# Patient Record
Sex: Female | Born: 1955
Health system: Southern US, Community
[De-identification: ages and names within clinical notes are randomized; demographics above are authoritative.]

## PROBLEM LIST (undated history)

## (undated) DIAGNOSIS — E079 Disorder of thyroid, unspecified: Secondary | ICD-10-CM

## (undated) DIAGNOSIS — K900 Celiac disease: Secondary | ICD-10-CM

## (undated) DIAGNOSIS — Z1371 Encounter for nonprocreative screening for genetic disease carrier status: Secondary | ICD-10-CM

## (undated) DIAGNOSIS — M8580 Other specified disorders of bone density and structure, unspecified site: Secondary | ICD-10-CM

## (undated) DIAGNOSIS — I10 Essential (primary) hypertension: Secondary | ICD-10-CM

## (undated) DIAGNOSIS — Z8659 Personal history of other mental and behavioral disorders: Secondary | ICD-10-CM

## (undated) HISTORY — DX: Encounter for nonprocreative screening for genetic disease carrier status: Z13.71

## (undated) HISTORY — DX: Celiac disease: K90.0

## (undated) HISTORY — DX: Other specified disorders of bone density and structure, unspecified site: M85.80

## (undated) HISTORY — DX: Disorder of thyroid, unspecified: E07.9

## (undated) HISTORY — PX: ABDOMINAL HYSTERECTOMY: SHX81

## (undated) HISTORY — DX: Personal history of other mental and behavioral disorders: Z86.59

---

## 1999-06-29 ENCOUNTER — Other Ambulatory Visit: Admission: RE | Admit: 1999-06-29 | Discharge: 1999-06-29 | Payer: Self-pay | Admitting: *Deleted

## 2000-03-28 ENCOUNTER — Encounter: Admission: RE | Admit: 2000-03-28 | Discharge: 2000-03-28 | Payer: Self-pay | Admitting: Family Medicine

## 2000-03-28 ENCOUNTER — Encounter: Payer: Self-pay | Admitting: Family Medicine

## 2000-05-10 ENCOUNTER — Encounter: Payer: Self-pay | Admitting: *Deleted

## 2000-05-10 ENCOUNTER — Other Ambulatory Visit: Admission: RE | Admit: 2000-05-10 | Discharge: 2000-05-10 | Payer: Self-pay | Admitting: *Deleted

## 2000-05-10 ENCOUNTER — Encounter: Admission: RE | Admit: 2000-05-10 | Discharge: 2000-05-10 | Payer: Self-pay | Admitting: *Deleted

## 2000-05-10 ENCOUNTER — Encounter (INDEPENDENT_AMBULATORY_CARE_PROVIDER_SITE_OTHER): Payer: Self-pay | Admitting: *Deleted

## 2000-08-03 ENCOUNTER — Other Ambulatory Visit: Admission: RE | Admit: 2000-08-03 | Discharge: 2000-08-03 | Payer: Self-pay | Admitting: Obstetrics and Gynecology

## 2001-07-24 ENCOUNTER — Other Ambulatory Visit: Admission: RE | Admit: 2001-07-24 | Discharge: 2001-07-24 | Payer: Self-pay | Admitting: Obstetrics and Gynecology

## 2001-07-26 ENCOUNTER — Encounter: Admission: RE | Admit: 2001-07-26 | Discharge: 2001-07-26 | Payer: Self-pay | Admitting: Obstetrics and Gynecology

## 2001-07-26 ENCOUNTER — Encounter: Payer: Self-pay | Admitting: Obstetrics and Gynecology

## 2001-11-27 ENCOUNTER — Encounter (INDEPENDENT_AMBULATORY_CARE_PROVIDER_SITE_OTHER): Payer: Self-pay

## 2001-11-27 ENCOUNTER — Observation Stay (HOSPITAL_COMMUNITY): Admission: RE | Admit: 2001-11-27 | Discharge: 2001-11-28 | Payer: Self-pay | Admitting: Obstetrics and Gynecology

## 2001-11-27 HISTORY — PX: VAGINAL HYSTERECTOMY: SHX2639

## 2002-08-20 ENCOUNTER — Encounter: Payer: Self-pay | Admitting: Obstetrics and Gynecology

## 2002-08-20 ENCOUNTER — Encounter: Admission: RE | Admit: 2002-08-20 | Discharge: 2002-08-20 | Payer: Self-pay | Admitting: Obstetrics and Gynecology

## 2003-08-28 ENCOUNTER — Encounter: Admission: RE | Admit: 2003-08-28 | Discharge: 2003-08-28 | Payer: Self-pay | Admitting: Obstetrics and Gynecology

## 2004-07-11 HISTORY — PX: FEMORAL HERNIA REPAIR: SUR1179

## 2004-09-10 ENCOUNTER — Encounter: Admission: RE | Admit: 2004-09-10 | Discharge: 2004-09-10 | Payer: Self-pay | Admitting: Family Medicine

## 2005-09-08 DIAGNOSIS — K900 Celiac disease: Secondary | ICD-10-CM

## 2005-09-08 HISTORY — DX: Celiac disease: K90.0

## 2005-10-03 ENCOUNTER — Ambulatory Visit (HOSPITAL_COMMUNITY): Admission: RE | Admit: 2005-10-03 | Discharge: 2005-10-03 | Payer: Self-pay | Admitting: Gastroenterology

## 2005-10-03 ENCOUNTER — Encounter (INDEPENDENT_AMBULATORY_CARE_PROVIDER_SITE_OTHER): Payer: Self-pay | Admitting: Specialist

## 2005-10-04 ENCOUNTER — Encounter (INDEPENDENT_AMBULATORY_CARE_PROVIDER_SITE_OTHER): Payer: Self-pay | Admitting: *Deleted

## 2005-10-04 ENCOUNTER — Ambulatory Visit (HOSPITAL_COMMUNITY): Admission: RE | Admit: 2005-10-04 | Discharge: 2005-10-04 | Payer: Self-pay | Admitting: Gastroenterology

## 2005-10-04 HISTORY — PX: ESOPHAGOGASTRODUODENOSCOPY ENDOSCOPY: SHX5814

## 2005-10-04 HISTORY — PX: COLONOSCOPY: SHX174

## 2005-10-12 ENCOUNTER — Encounter: Admission: RE | Admit: 2005-10-12 | Discharge: 2005-10-12 | Payer: Self-pay | Admitting: Obstetrics & Gynecology

## 2005-10-12 IMAGING — MG MM MAMMO SCREENING
5 series · 5 of 5 positions shown · non-contrast
Comparison: none

SCREENING MAMMOGRAM:
The breast tissue is extremely dense.  Microcalcifications are present in the left breast.  
Characterization with magnification views is recommended.  No mass or malignant type calcifications
are identified in the right breast.  Compared with prior studies.

[R CC]
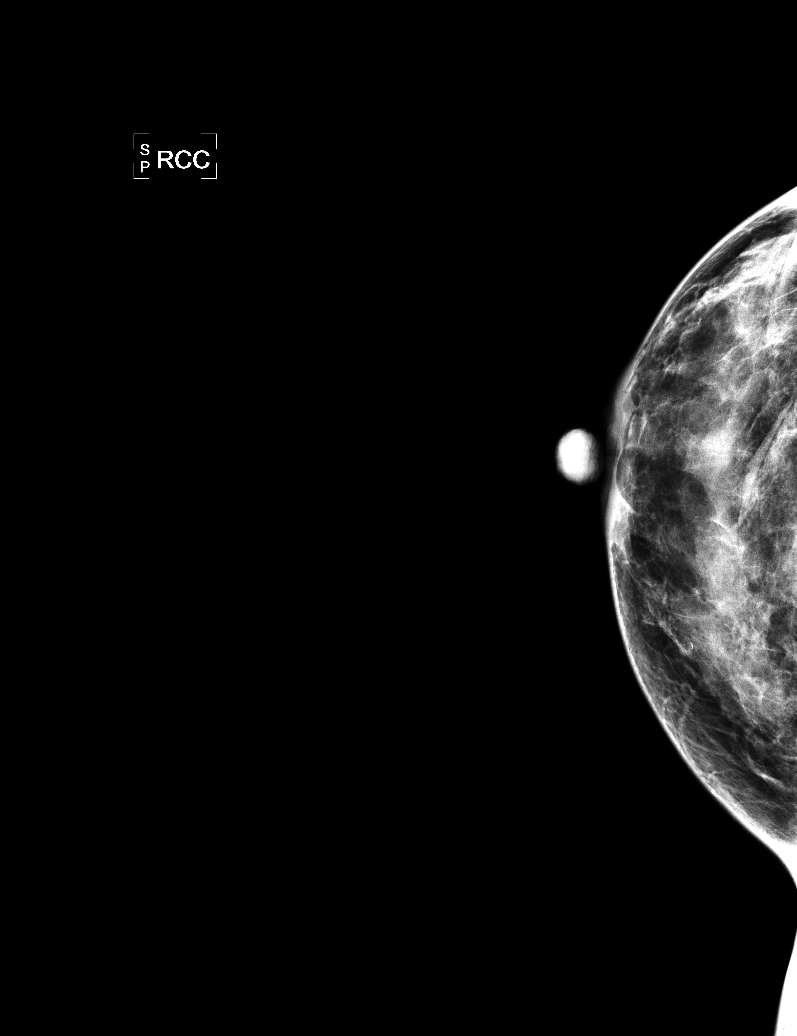

[L CC]
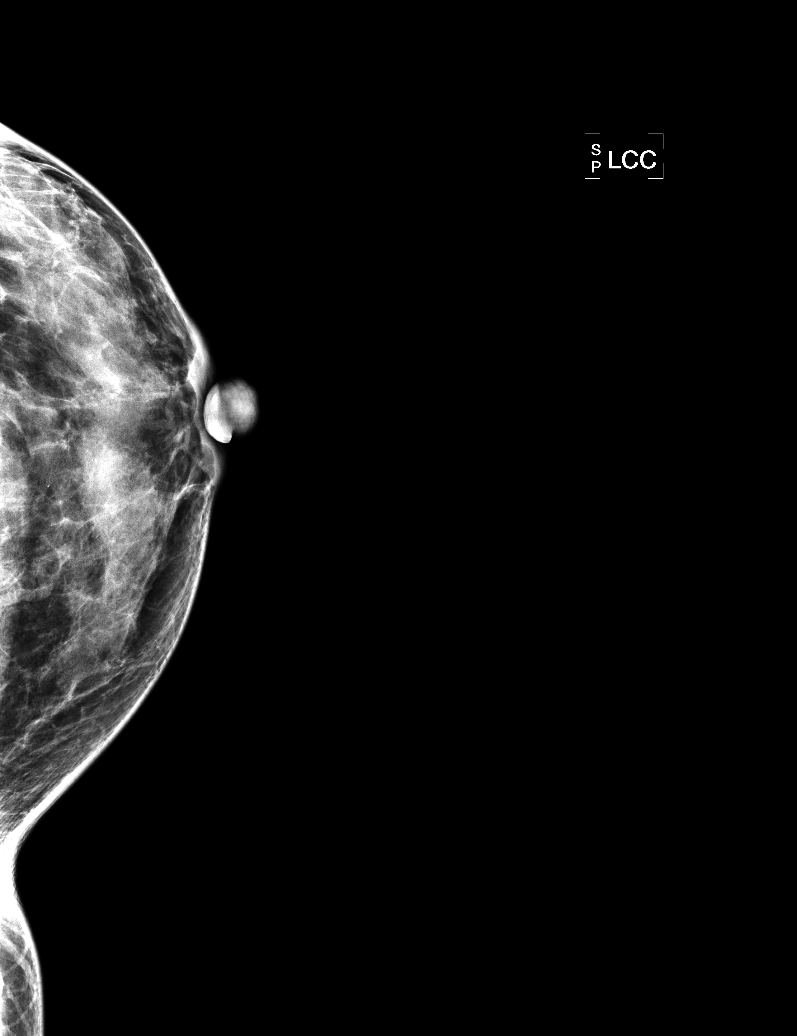

[L MLO]
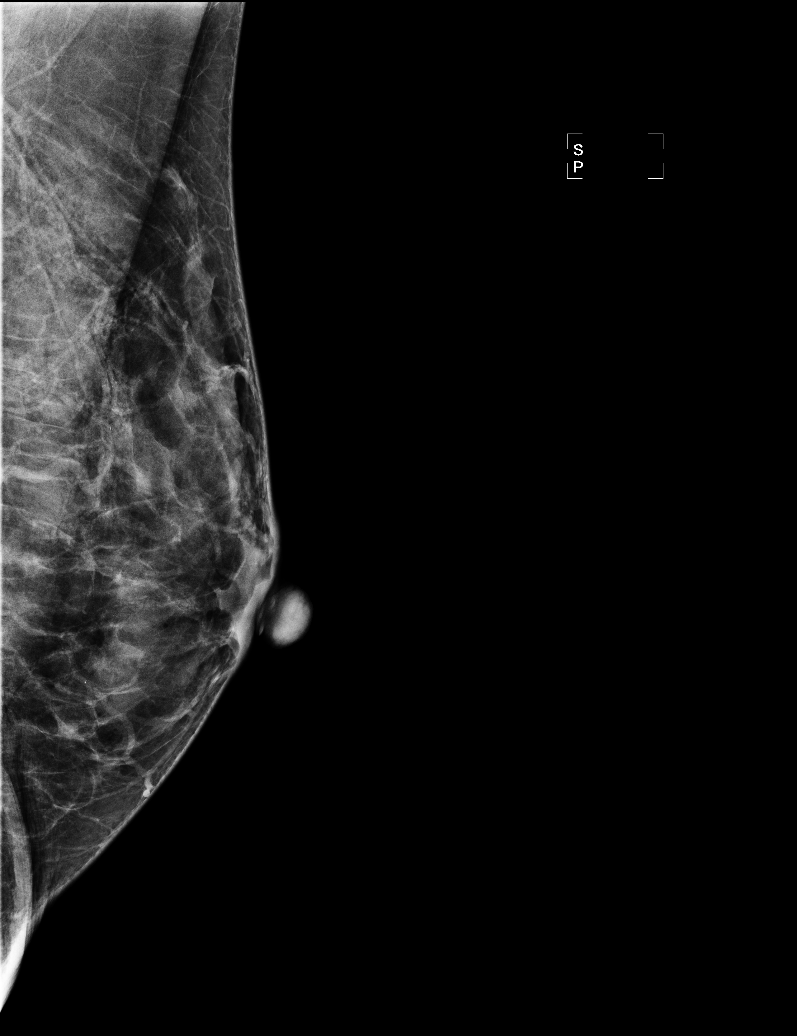

[R MLO (1 of 2)]
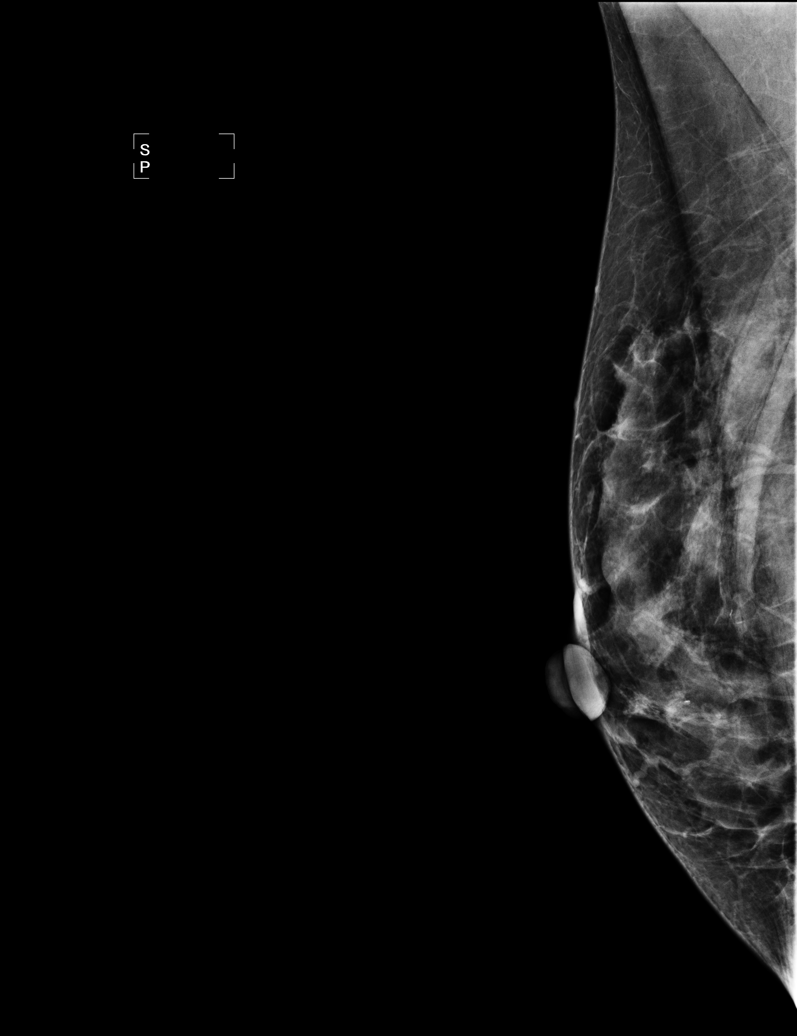

[R MLO (2 of 2)]
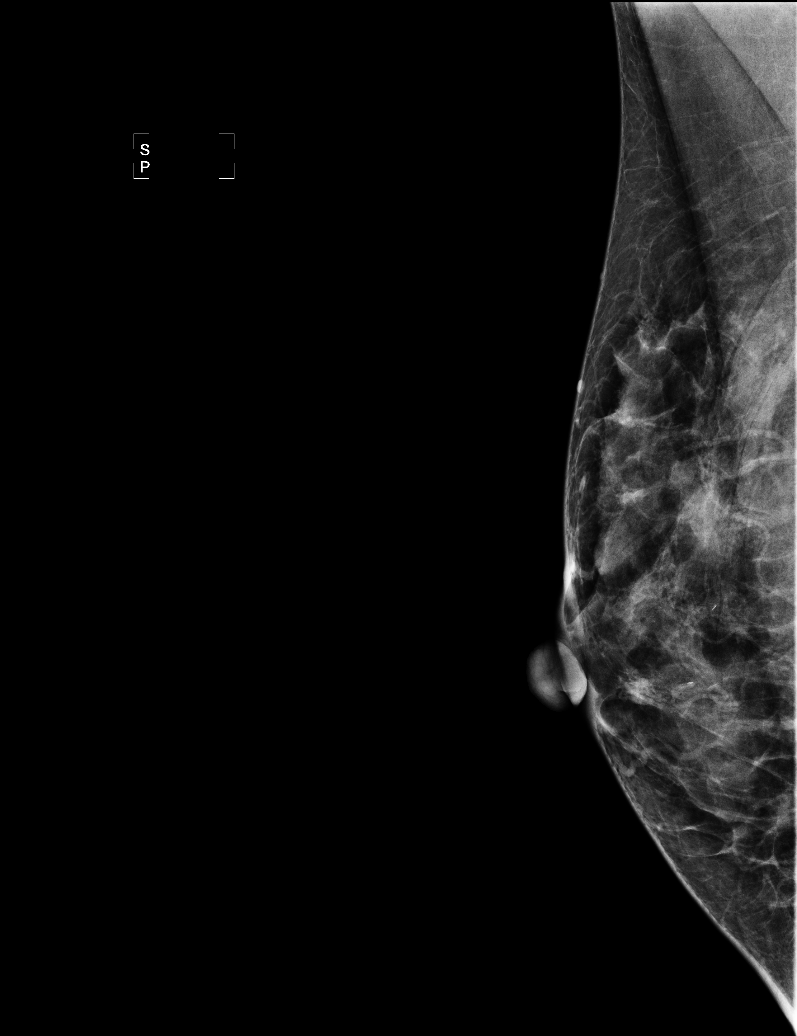

[5 of 5 positions shown; findings below may reference images not displayed]

IMPRESSION: Calcifications, left breast.  Additional evaluation is indicated. The patient will be contacted for
additional studies and a supplementary report will follow.  No specific mammographic evidence of 
malignancy, right breast.

ASSESSMENT: Need additional imaging evaluation and/or prior mammograms for comparison - BI-RADS 0

Further imaging of the left breast.

## 2005-11-11 ENCOUNTER — Encounter: Admission: RE | Admit: 2005-11-11 | Discharge: 2005-11-11 | Payer: Self-pay | Admitting: Obstetrics and Gynecology

## 2005-11-11 IMAGING — MG MM DIAGNOSTIC LTD LEFT
2 series · 2 of 2 positions shown · non-contrast
Comparison: Prior studies from [DATE] and [DATE].

[REDACTED] LEFT
CC and MLO view(s) were taken of the left breast.

DIGITAL LIMITED LEFT DIAGNOSTIC MAMMOGRAM:
CLINICAL DATA: 49-year-old female with abnormal screening mammogram with microcalcifications.

[L CC]
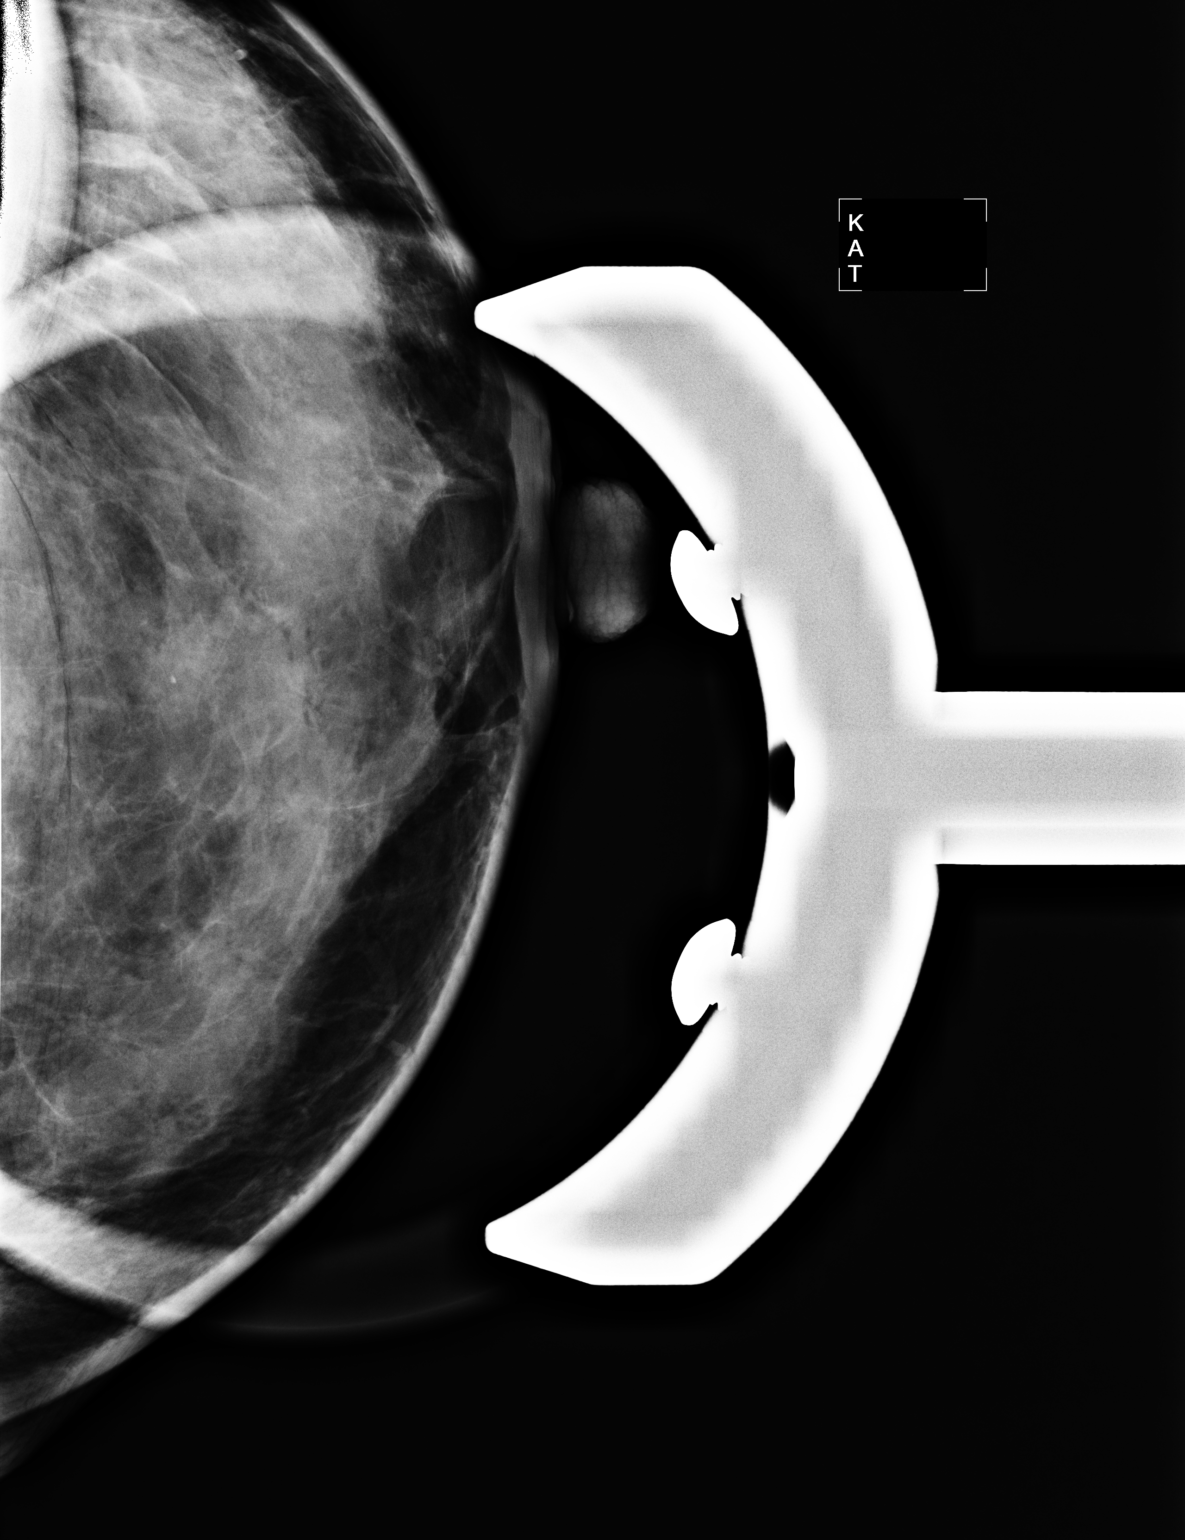

[L ML]
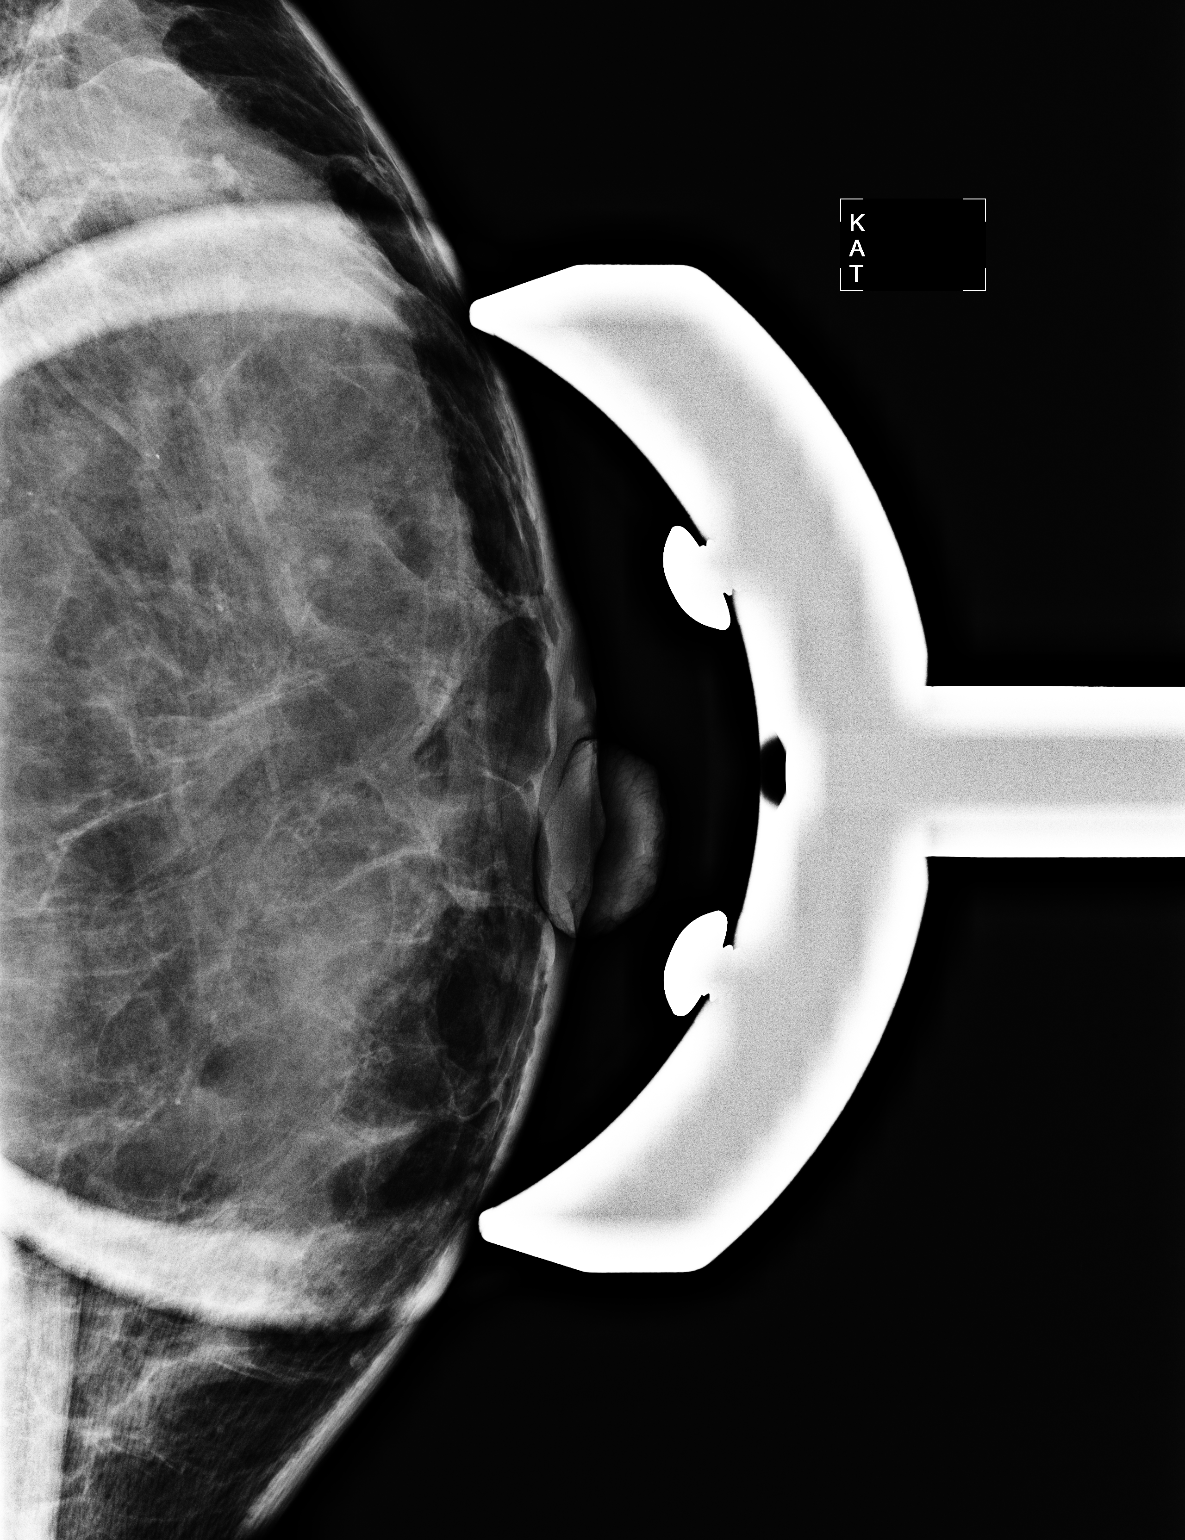

[2 of 2 positions shown; findings below may reference images not displayed]

Spot magnification views of the left subareolar region in the CC and MLO projection demonstrate a 
cluster of  ill-defined microcalcification smudgy and rounded in appearance.  These were vaguely 
present on the [NC] study.
IMPRESSION: Stable cluster of microcalcifications in the left subareolar region.  No evidence of malignancy.  
Recommend annual screening mammogram.

ASSESSMENT: Negative - BI-RADS 1

Screening mammogram of both breasts in 10 months.
, THIS PROCEDURE WAS A DIGITAL MAMMOGRAM.

## 2005-11-14 ENCOUNTER — Encounter: Admission: RE | Admit: 2005-11-14 | Discharge: 2005-11-14 | Payer: Self-pay | Admitting: Gastroenterology

## 2006-10-16 ENCOUNTER — Encounter: Admission: RE | Admit: 2006-10-16 | Discharge: 2006-10-16 | Payer: Self-pay | Admitting: Obstetrics & Gynecology

## 2007-11-05 ENCOUNTER — Encounter: Admission: RE | Admit: 2007-11-05 | Discharge: 2007-11-05 | Payer: Self-pay | Admitting: Family Medicine

## 2007-11-05 IMAGING — MG MM SCREEN MAMMOGRAM BILATERAL
4 series · 4 of 4 positions shown · non-contrast
Comparison: none

DG SCREEN MAMMOGRAM BILATERAL
Bilateral CC and MLO view(s) were taken.

DIGITAL SCREENING MAMMOGRAM WITH CAD:
The breast tissue is extremely dense.  No masses or malignant type calcifications are identified.  
Compared with prior studies.

[R CC]
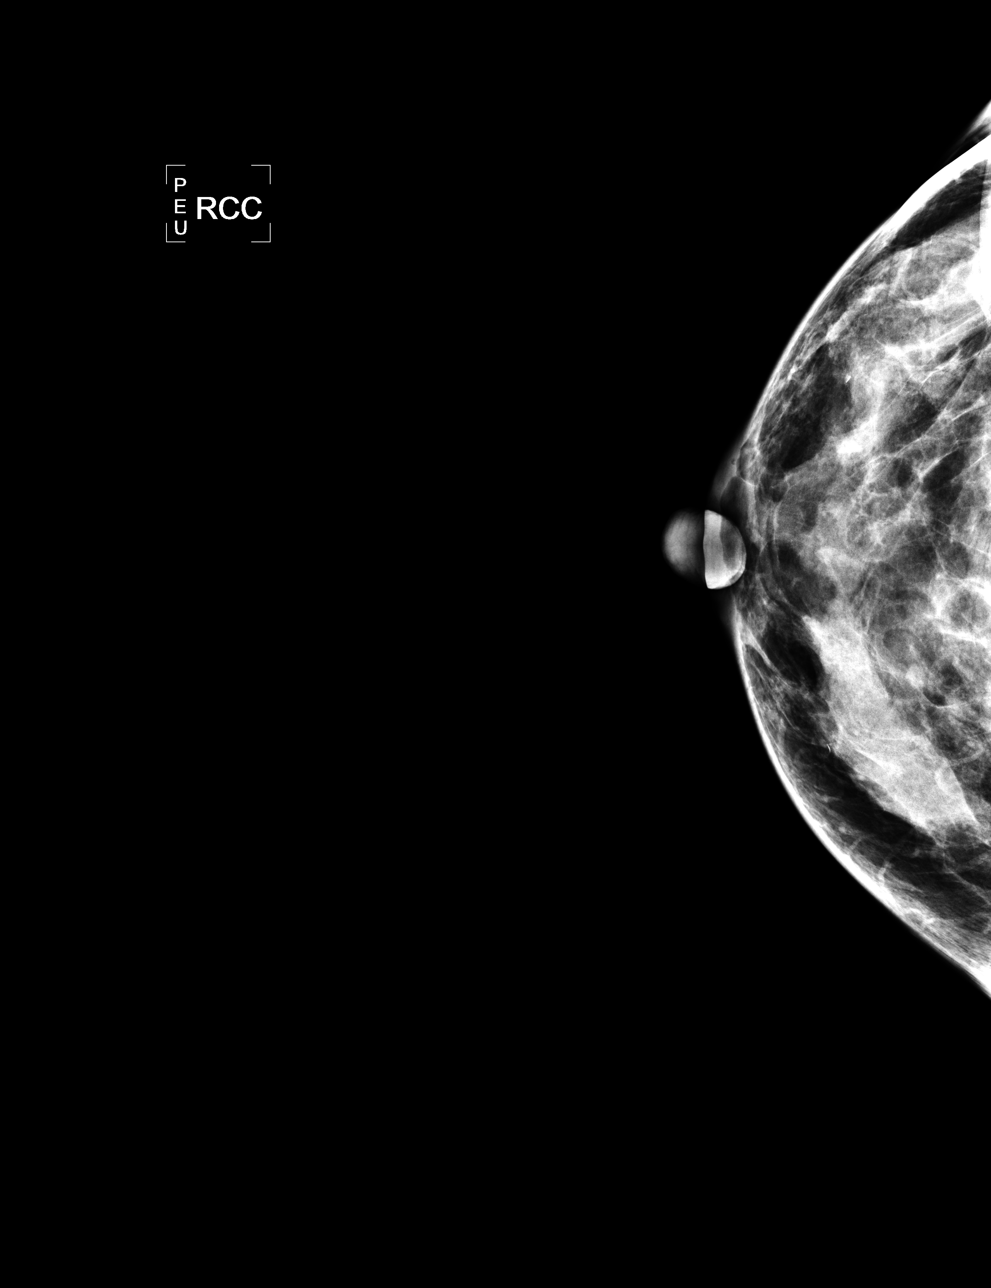

[L CC]
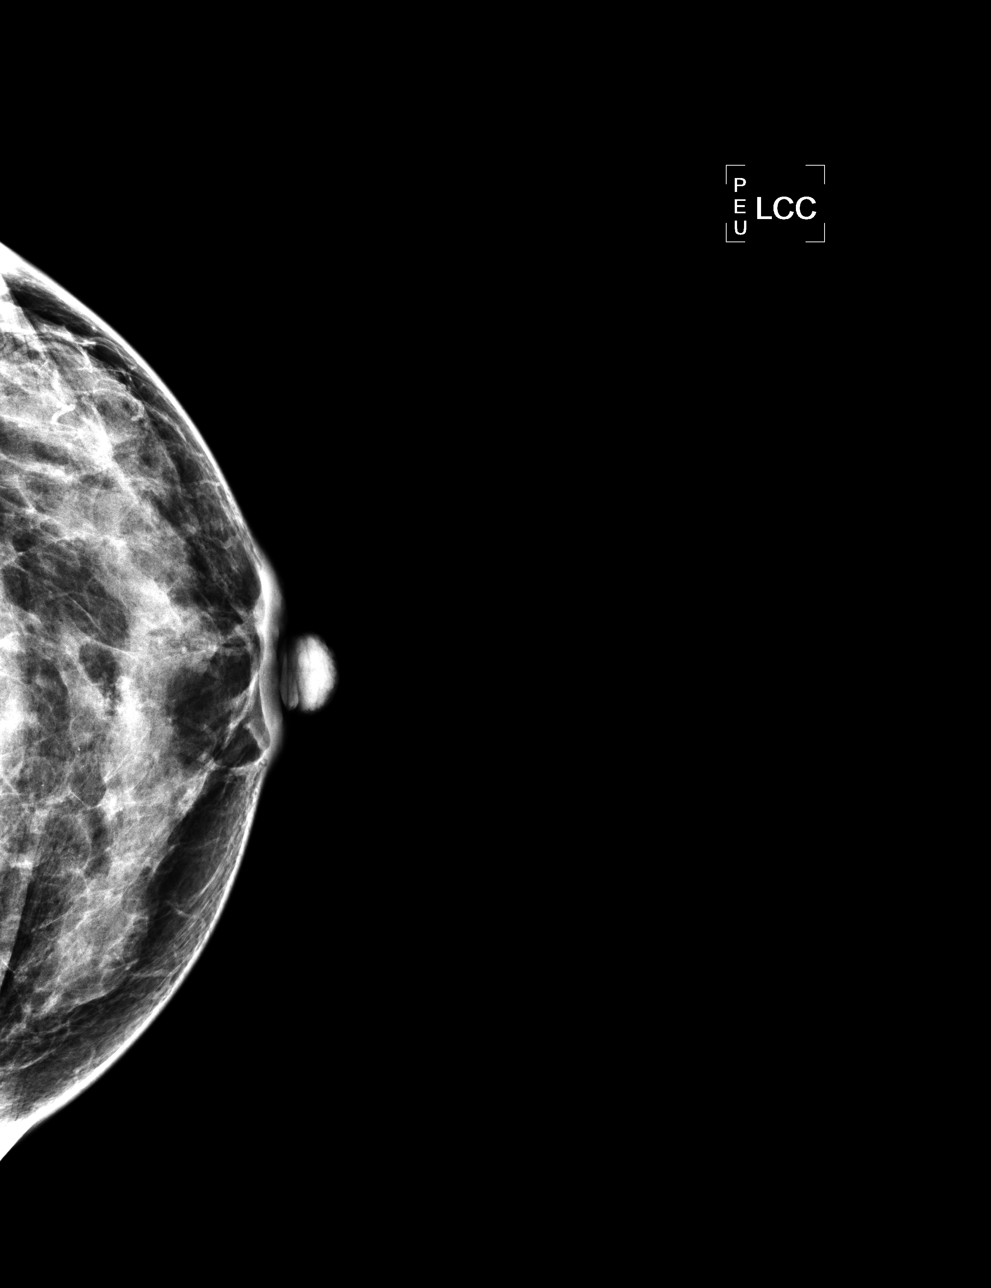

[L MLO]
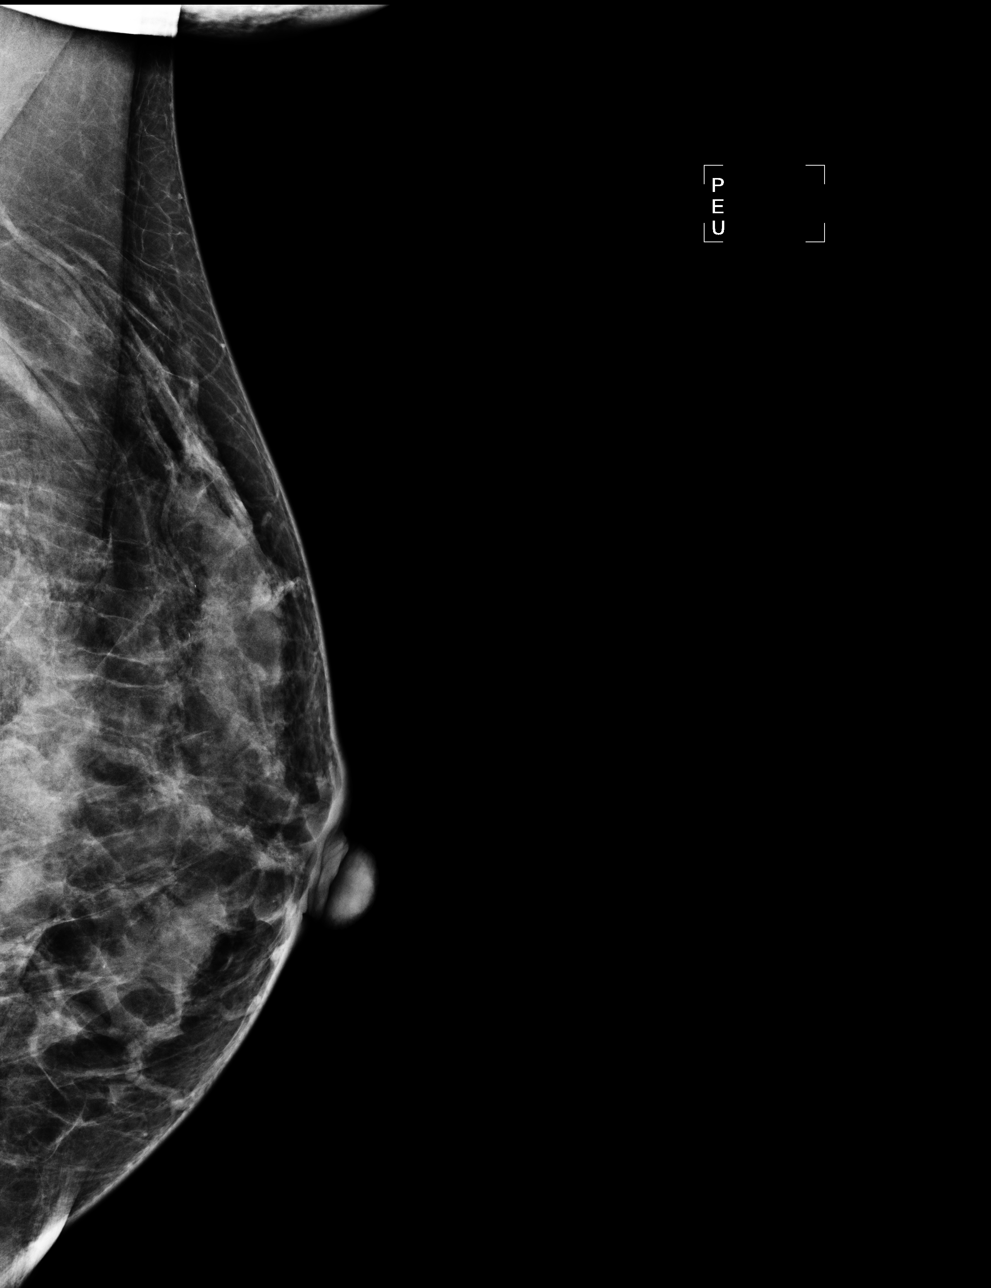

[R MLO]
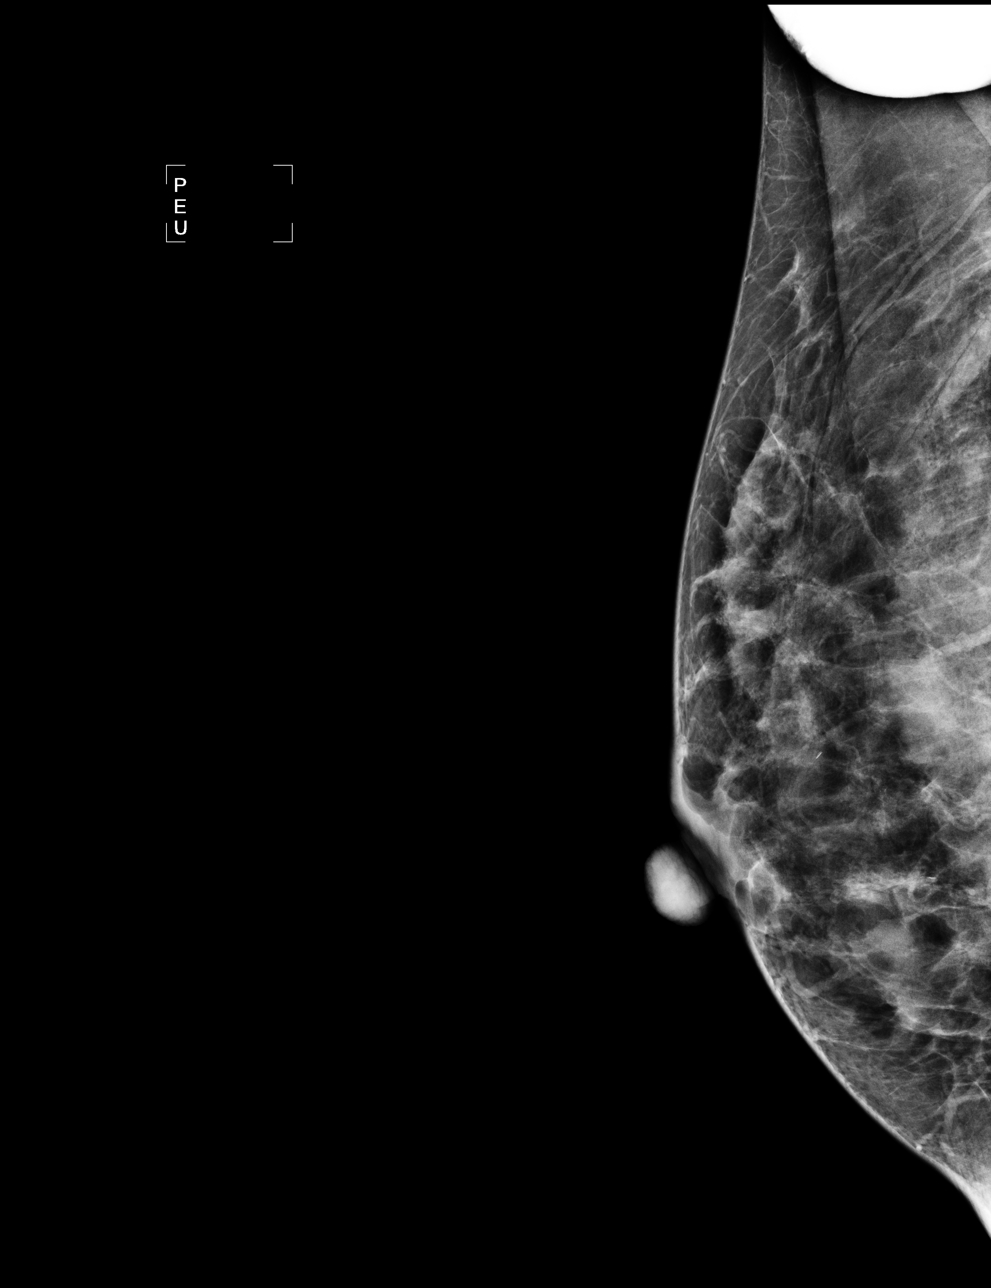

[4 of 4 positions shown; findings below may reference images not displayed]

IMPRESSION: No specific mammographic evidence of malignancy.  Next screening mammogram is recommended in one 
year.

ASSESSMENT: Negative - BI-RADS 1

Screening mammogram in 1 year.
ANALYZED BY COMPUTER AIDED DETECTION. , THIS PROCEDURE WAS A DIGITAL MAMMOGRAM.

## 2008-03-11 LAB — HM DEXA SCAN

## 2008-03-19 ENCOUNTER — Encounter: Admission: RE | Admit: 2008-03-19 | Discharge: 2008-03-19 | Payer: Self-pay | Admitting: Obstetrics & Gynecology

## 2008-11-05 ENCOUNTER — Encounter: Admission: RE | Admit: 2008-11-05 | Discharge: 2008-11-05 | Payer: Self-pay | Admitting: Family Medicine

## 2008-11-05 IMAGING — MG MM SCREEN MAMMOGRAM BILATERAL
2 series · 2 of 2 positions shown · non-contrast
Comparison: none

DG SCREEN MAMMOGRAM BILATERAL
Bilateral CC and MLO view(s) were taken.

DIGITAL SCREENING MAMMOGRAM WITH CAD:
The breast tissue is heterogeneously dense.  Possible architectural distortion is noted in the 
right breast.  Spot compression views and possibly sonography are recommended for further 
evaluation.  In the left breast, no masses or malignant type calcifications are identified.  
Compared with prior studies.

[L CC]
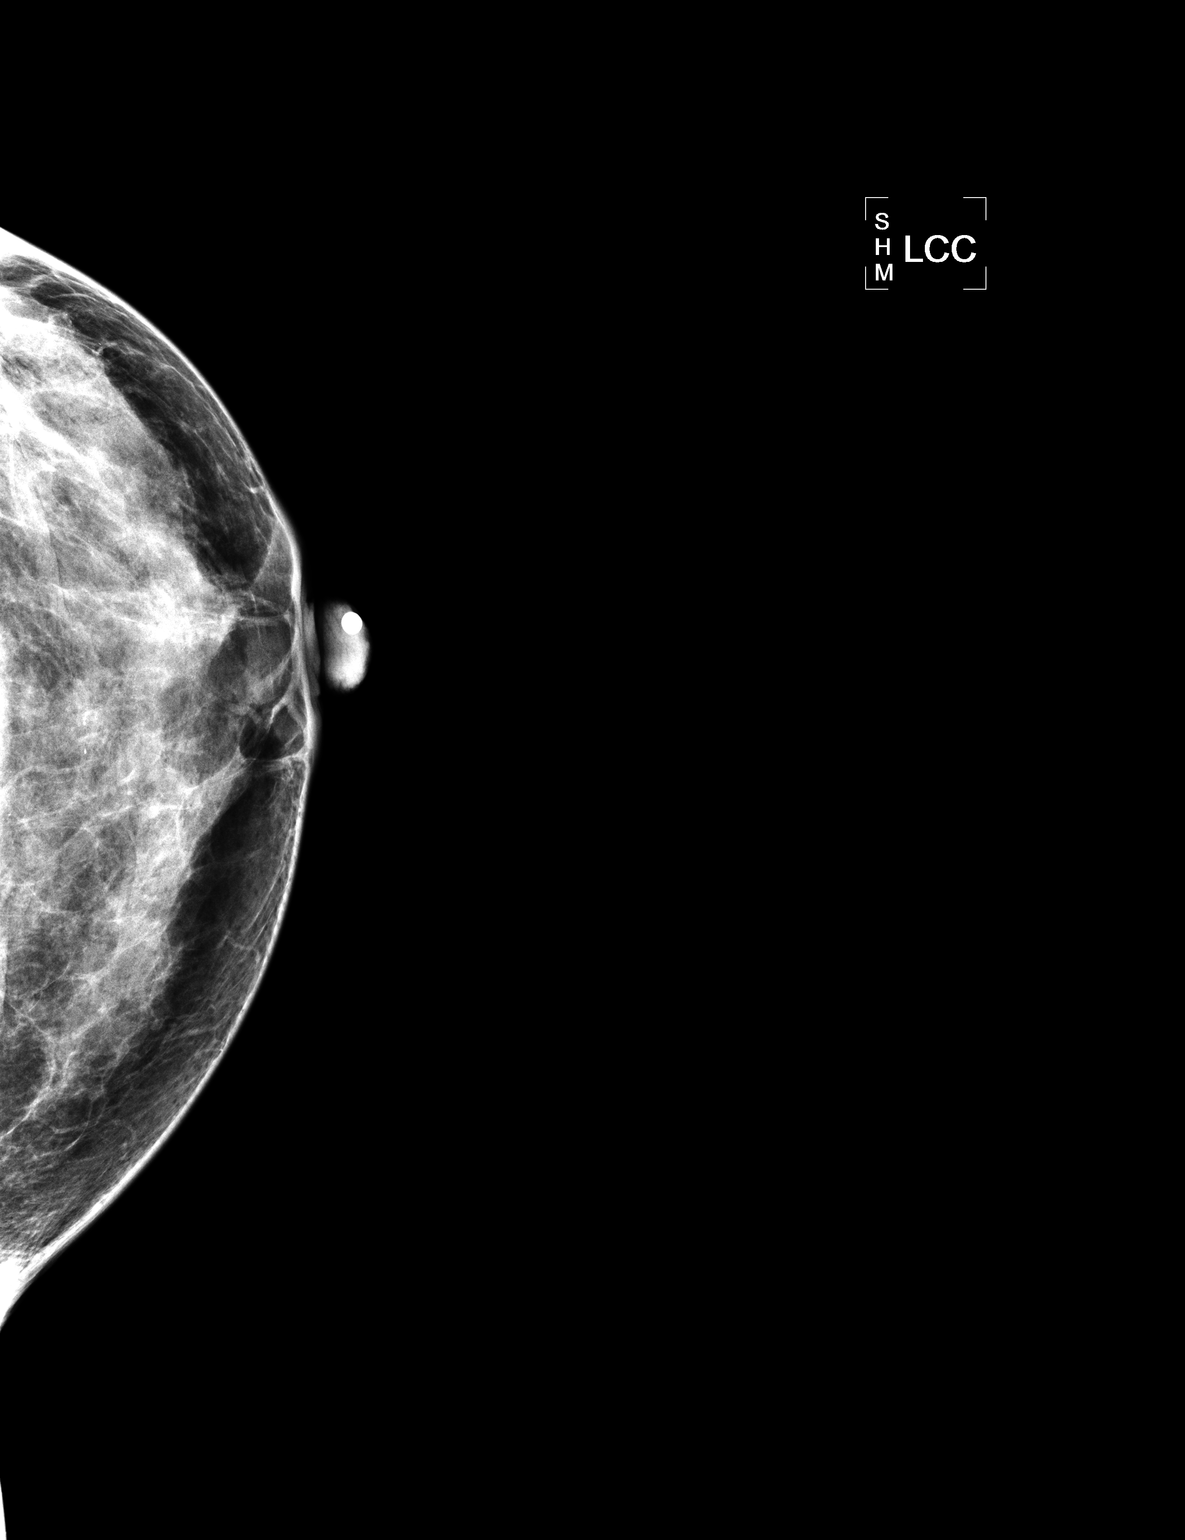

[R MLO]
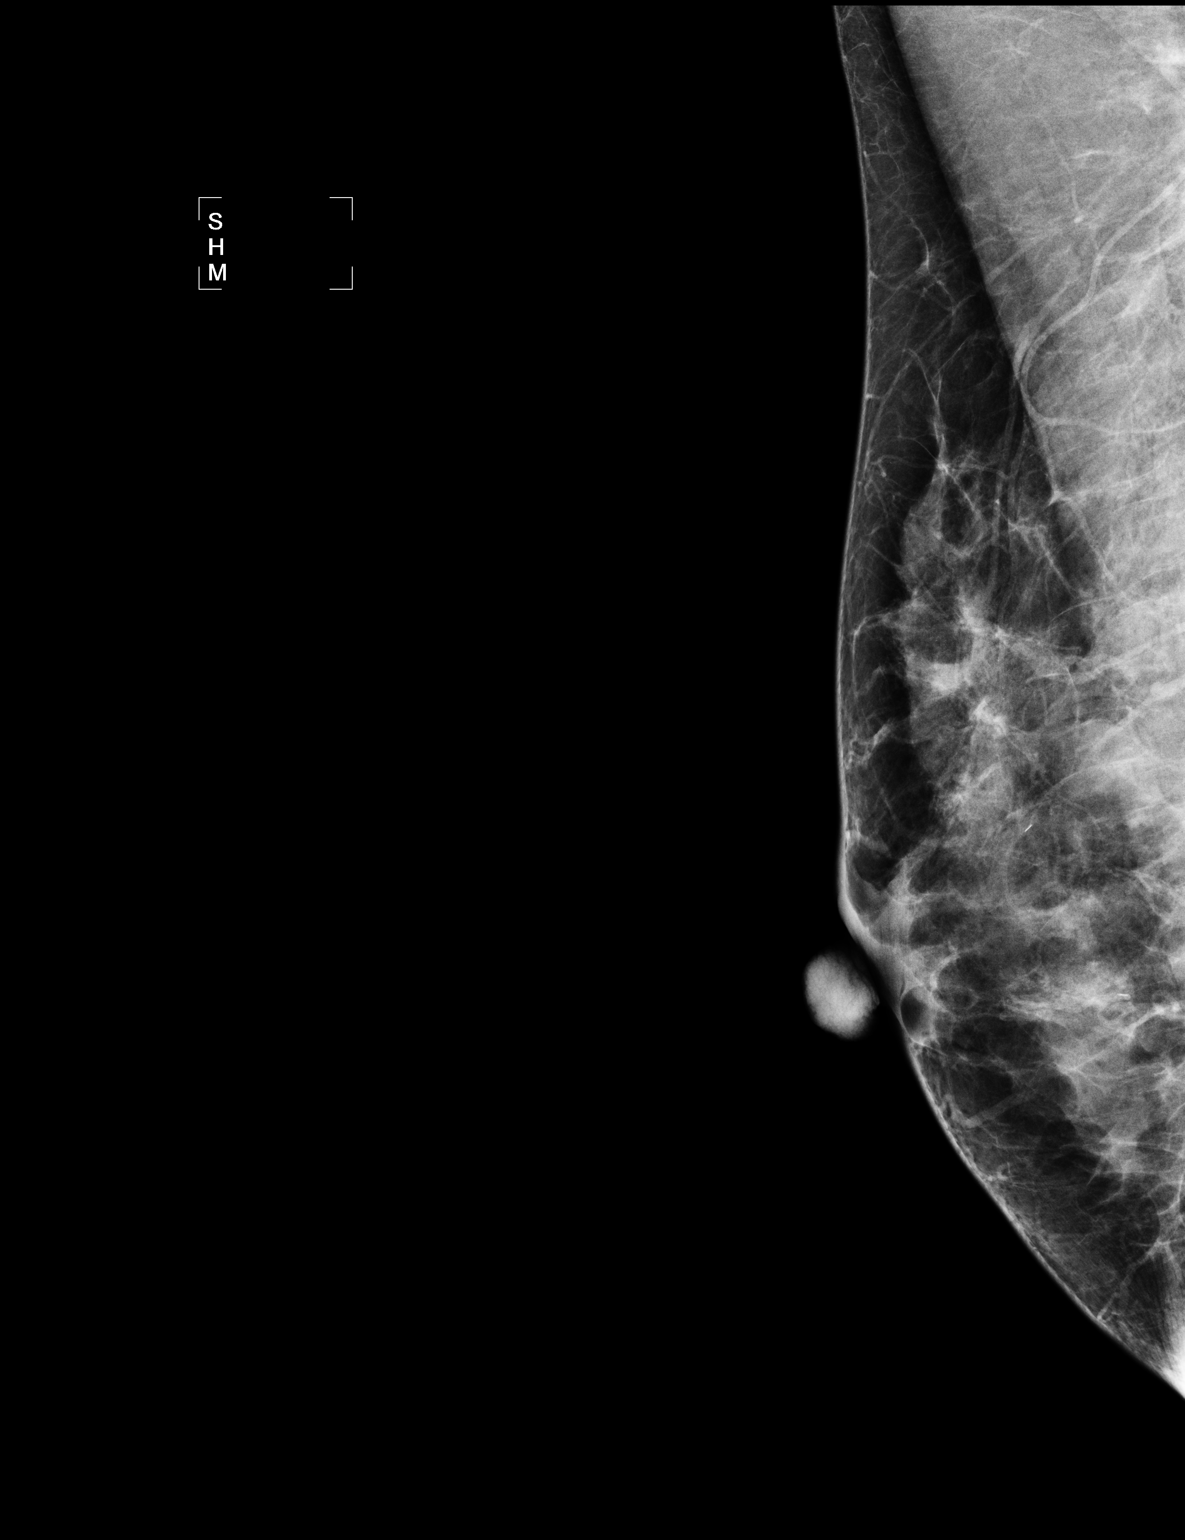

[2 of 2 positions shown; findings below may reference images not displayed]

IMPRESSION: Possible architectural distortion, right breast.  Additional evaluation is indicated.  The patient 
will be contacted for additional studies and a supplementary report will follow.  No specific 
mammographic evidence of malignancy, left breast.

ASSESSMENT: Need additional imaging evaluation and/or prior mammograms for comparison - BI-RADS 0

Further imaging of the right breast.
ANALYZED BY COMPUTER AIDED DETECTION. , THIS PROCEDURE WAS A DIGITAL MAMMOGRAM.

## 2008-11-07 ENCOUNTER — Encounter: Admission: RE | Admit: 2008-11-07 | Discharge: 2008-11-07 | Payer: Self-pay | Admitting: Family Medicine

## 2008-11-07 IMAGING — MG MM DIAGNOSTIC LTD RIGHT
1 series · 1 of 1 positions shown · non-contrast
Comparison: [DATE] and [DATE] as well as prior exams from
[F4] and [F4].

CLINICAL DATA: The patient presents for additional views of the
right breast as a follow up to recent screening mammogram from
[DATE] demonstrating possible distortion in the upper right
breast.

DIGITAL DIAGNOSTIC RIGHT MAMMOGRAM

[R MLO]
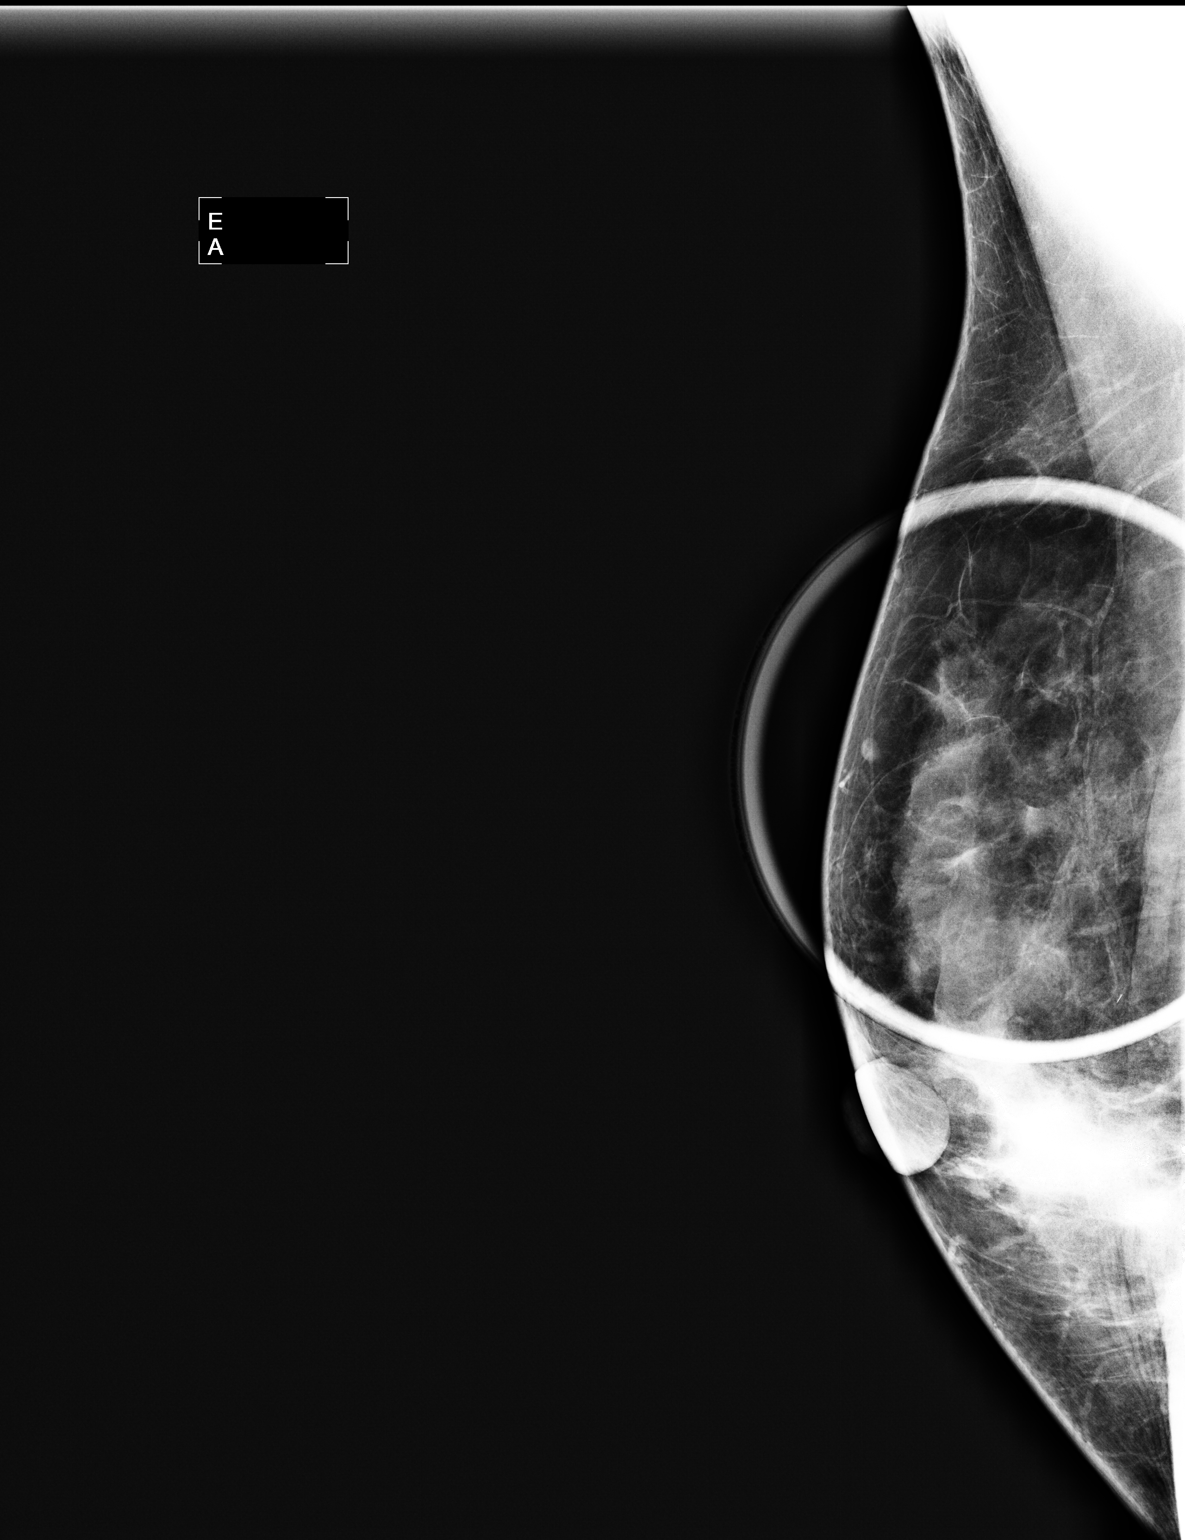

[1 of 1 positions shown; findings below may reference images not displayed]

FINDINGS: Spot compression MLO view as well as a true lateral view
were obtained.  There is no definite focal distortion seen in the
upper right breast as this likely represents overlapping
fibroglandular tissue.
IMPRESSION: No focal abnormality in the upper right breast.

Recommendations:  Recommend continued annual screening mammographic
followup.

BI-RADS CATEGORY 1:  Negative.

## 2009-11-12 ENCOUNTER — Encounter: Admission: RE | Admit: 2009-11-12 | Discharge: 2009-11-12 | Payer: Self-pay | Admitting: Family Medicine

## 2009-11-13 IMAGING — MG MM DIGITAL SCREENING
4 series · 4 of 4 positions shown · non-contrast
Comparison: none

DG SCREEN MAMMOGRAM BILATERAL
Bilateral CC and MLO view(s) were taken.
Technologist: HAYE

DIGITAL SCREENING MAMMOGRAM WITH CAD:
The breast tissue is extremely dense.  No masses or malignant type calcifications are identified.  
Compared with prior studies.
Images were processed with CAD.

[R CC]
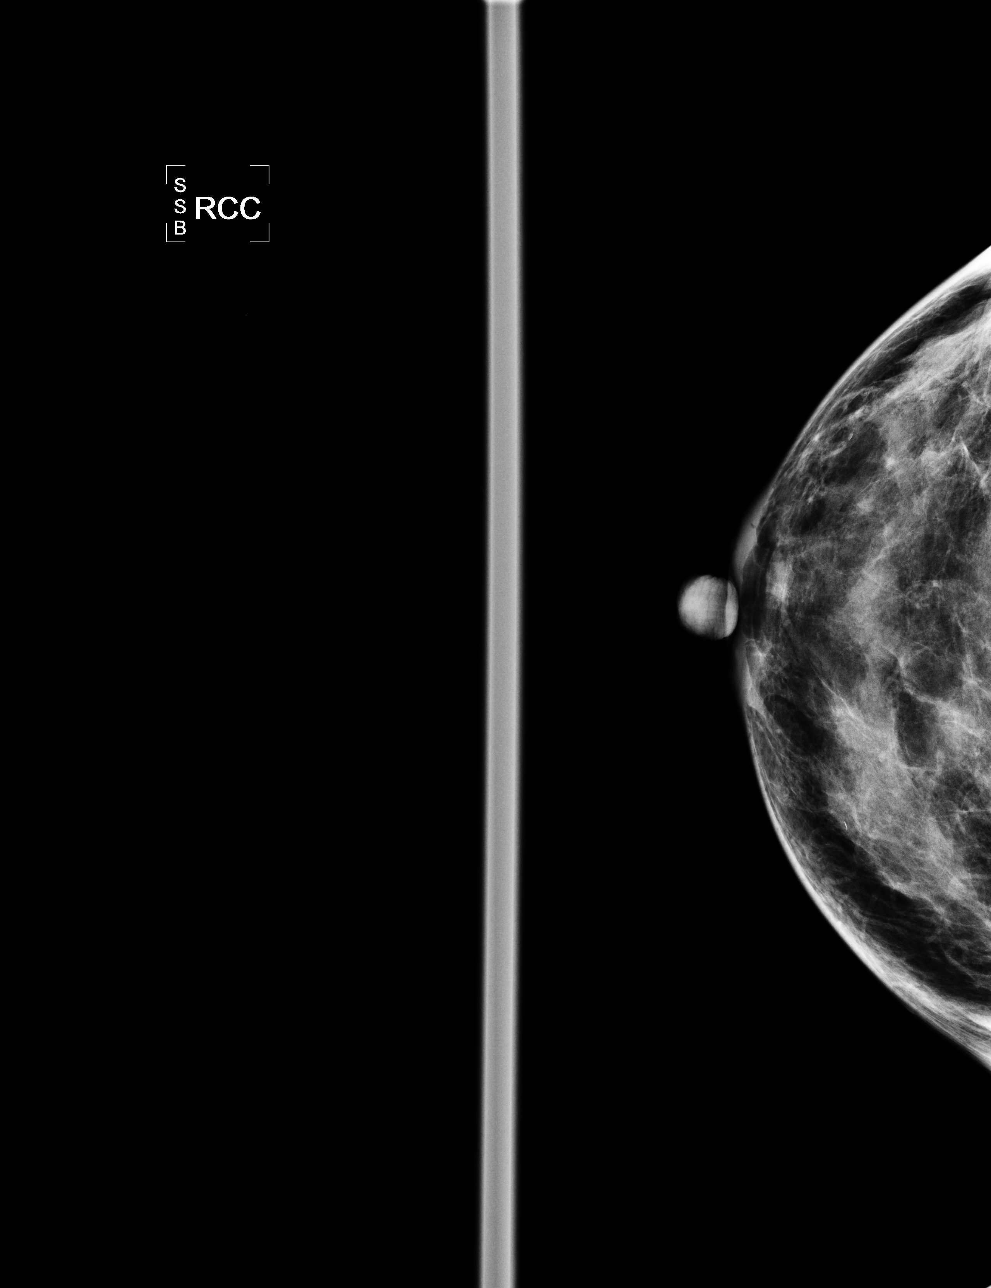

[L CC]
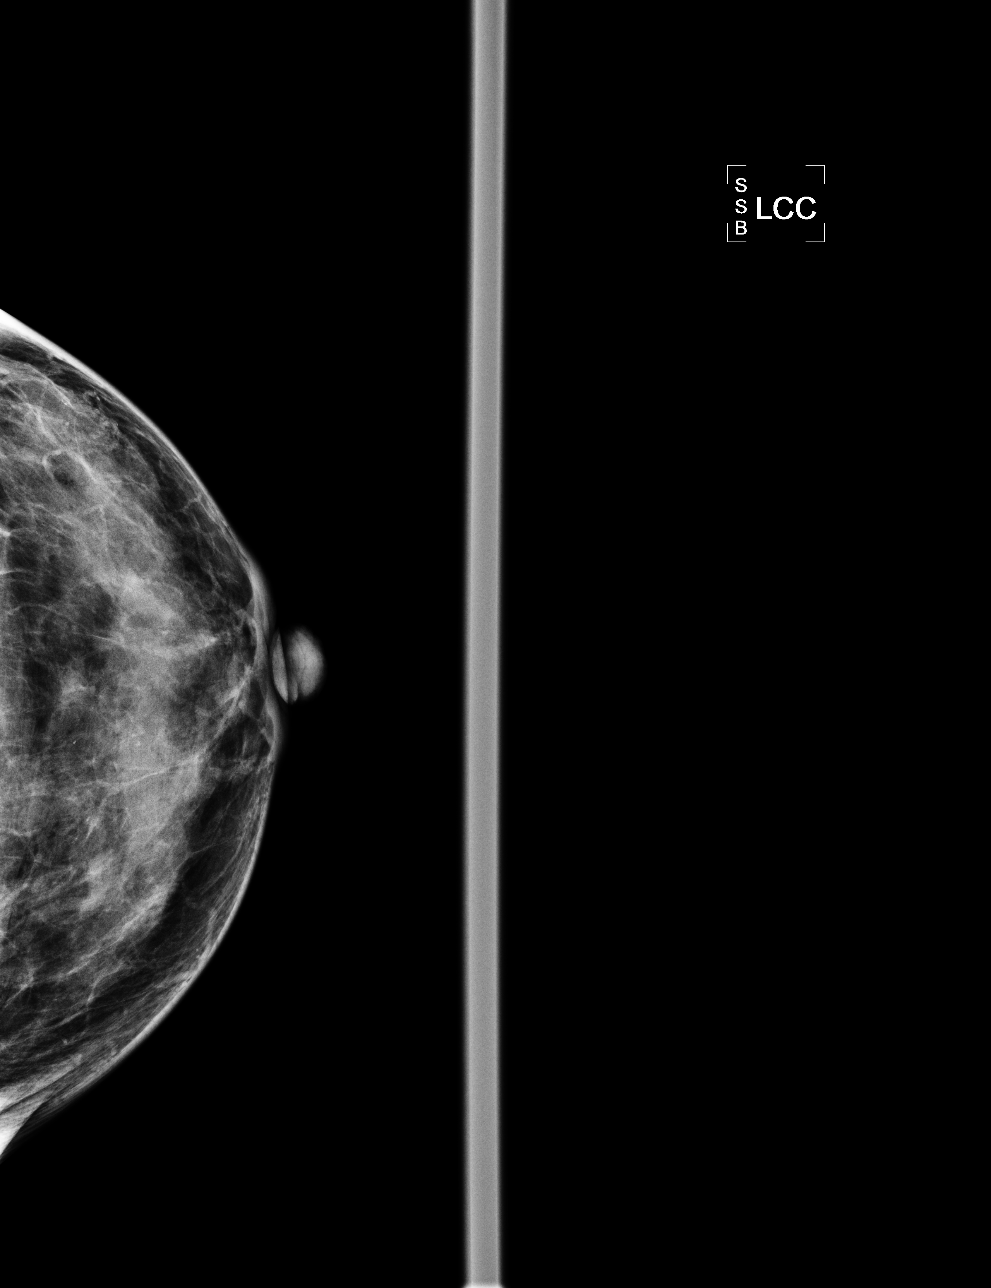

[L MLO]
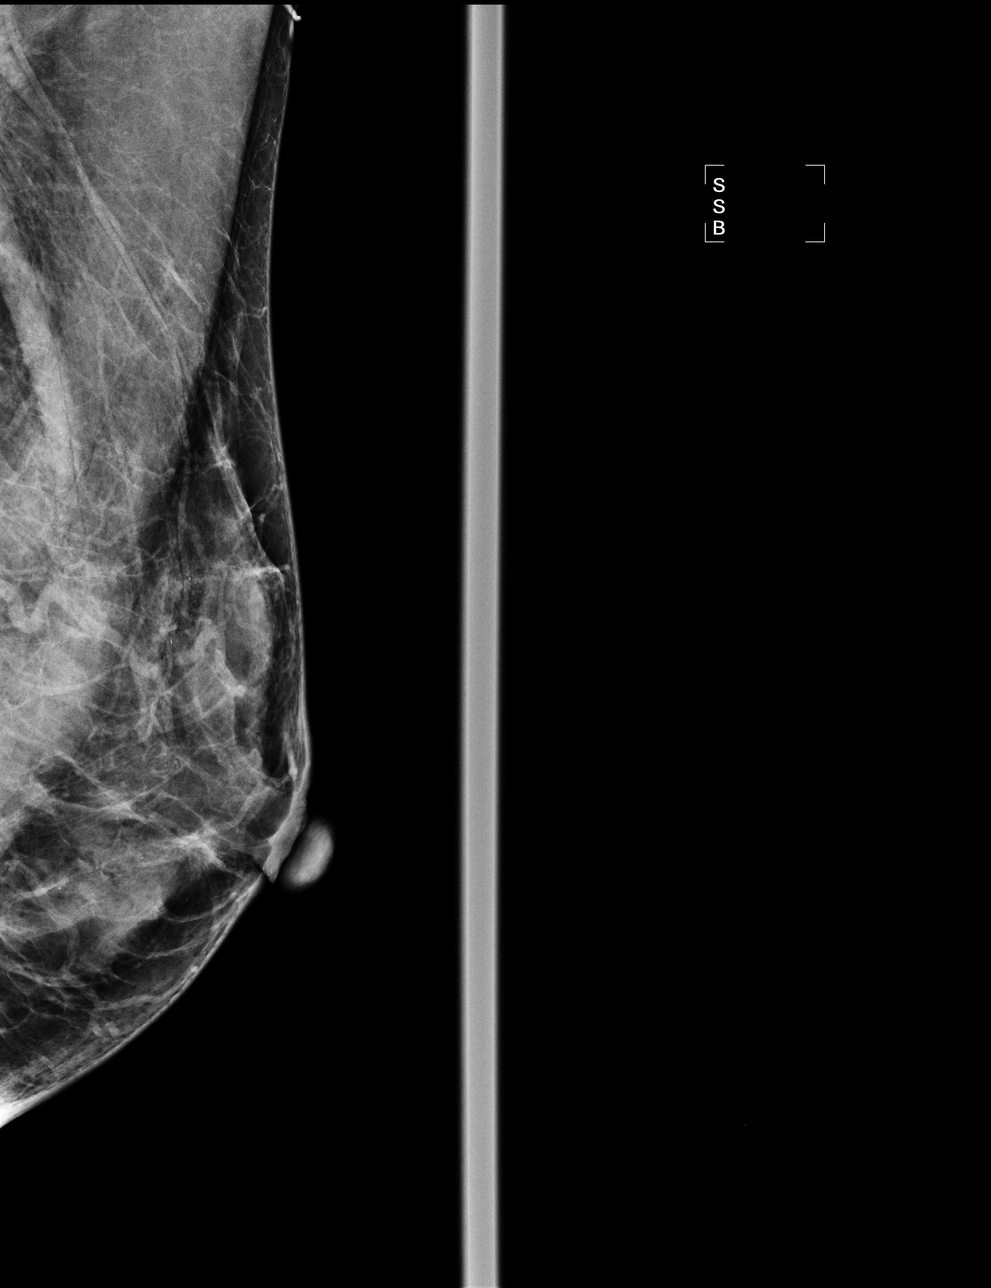

[R MLO]
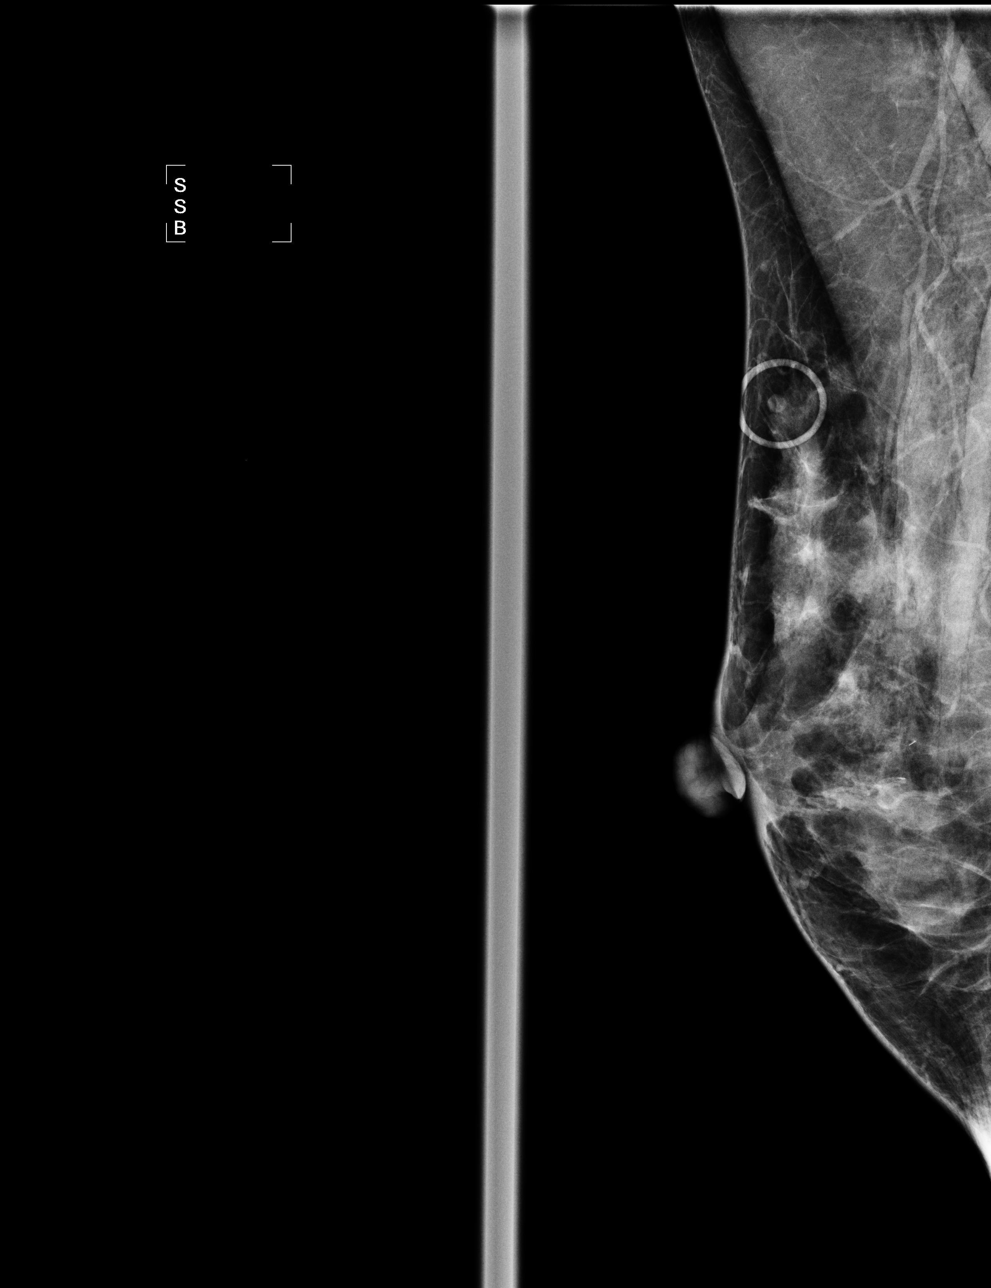

[4 of 4 positions shown; findings below may reference images not displayed]

IMPRESSION: No specific mammographic evidence of malignancy.  Next screening mammogram is recommended in one 
year.  Consider screening MRI every 1-2 years given the patient's high risk for breast cancer.

A result letter of this screening mammogram will be mailed directly to the patient.

ASSESSMENT: Negative - BI-RADS 1

Screening mammogram in 1 year.
,

## 2009-11-26 ENCOUNTER — Ambulatory Visit: Payer: Self-pay | Admitting: Genetic Counselor

## 2010-01-08 DIAGNOSIS — Z1371 Encounter for nonprocreative screening for genetic disease carrier status: Secondary | ICD-10-CM

## 2010-01-08 HISTORY — DX: Encounter for nonprocreative screening for genetic disease carrier status: Z13.71

## 2010-08-01 ENCOUNTER — Encounter: Payer: Self-pay | Admitting: Obstetrics and Gynecology

## 2010-11-01 ENCOUNTER — Other Ambulatory Visit: Payer: Self-pay | Admitting: Obstetrics and Gynecology

## 2010-11-01 DIAGNOSIS — M8580 Other specified disorders of bone density and structure, unspecified site: Secondary | ICD-10-CM

## 2010-11-02 ENCOUNTER — Other Ambulatory Visit: Payer: Self-pay | Admitting: Obstetrics and Gynecology

## 2010-11-02 DIAGNOSIS — Z1231 Encounter for screening mammogram for malignant neoplasm of breast: Secondary | ICD-10-CM

## 2010-11-17 ENCOUNTER — Ambulatory Visit
Admission: RE | Admit: 2010-11-17 | Discharge: 2010-11-17 | Disposition: A | Discharge status: Home / Self Care | Payer: BC Managed Care – PPO | Source: Ambulatory Visit | Attending: Obstetrics and Gynecology | Admitting: Obstetrics and Gynecology

## 2010-11-17 DIAGNOSIS — Z1231 Encounter for screening mammogram for malignant neoplasm of breast: Secondary | ICD-10-CM

## 2010-11-17 DIAGNOSIS — M8580 Other specified disorders of bone density and structure, unspecified site: Secondary | ICD-10-CM

## 2010-11-26 NOTE — Op Note (Signed)
[facility]  Patient:    Cynthia Alexander, Cynthia Alexander Visit Number: [id number] MRN: [id number]          Service Type: DSU Location: [id number] Attending Physician:  Jenean Lindau Dictated by:   Laqueta Linden, M.D. Proc. Date: 11/27/01 Admit Date:  11/27/2001 Discharge Date: 11/28/2001   CC:         Miguel Aschoff, M.D.   Operative Report  PREOPERATIVE DIAGNOSES:       Adenomyosis of the uterus.  POSTOPERATIVE DIAGNOSES:      Adenomyosis of the uterus.  PROCEDURE:                    Transvaginal hysterectomy.  SURGEON:                      Laqueta Linden, M.D.  ASSISTANT:                    Darden Dates. Clelia Croft, M.D.  ANESTHESIA:                   Spinal with IV sedation.  ESTIMATED BLOOD LOSS:         100 cc.  URINE OUTPUT:                 150 cc of clear urine catheterized at the conclusion of the procedure.  FLUIDS:                       1100 cc of Crystalloid.  COUNTS:                       Correct x2.  COMPLICATIONS:                None.  INDICATIONS:                  Cynthia Alexander is a 55 year old gravida 5, para 5 married white female who has a many year history of menometrorrhagia which is refractory to medical management.  She is not a candidate for combination birth control pills due to her hypertension.  She took progesterone only pills without response.  She has undergone a pelvic ultrasound which has myometrial streaking consistent with adenomyosis and a sonohysterogram which revealed no endometrial lesions amenable to conservative surgery.  She was offered alternatives of ongoing progesterone only pills versus endometrial ablation by one of several methods versus vaginal hysterectomy.  She elected to proceed with definitive surgical management.  She has been extensively counseled as to the risks, benefits, alternatives, and complications including, but not limited to, anesthesia risks, infection, bleeding possibly requiring transfusion,  injury to bowel, bladder, ureters, vessels, or nerves, possibility of fistula formation, possibility of DVT, PE, postoperative pneumonia, or even death.  In addition, we have discussed postoperative expectations regarding sexual functioning, eventual menopause, and possible need for hormone replacement therapy as well as her postoperative recovery expectations.  She and her husband have seen the informed consent film, have voiced their understanding and acceptance of all risks had all their questions answered, and agree to proceed.  The patient has a history of tricuspid regurgitation and therefore has received ampicillin and gentamicin preoperatively.  PROCEDURE:                    The patient was taken to the operating room and after proper identification and consents were ascertained, she was placed  on the operating room in the supine position.  She was then placed in the sitting position after attachment of monitors and a spinal anesthetic was placed per Dr. Arby Barrette.  She was returned to the supine position.  After an adequate level of spinal was ascertained, she was placed in the candy cane stirrups and the perineum and vagina were prepped and draped in a routine sterile fashion. The bladder was emptied with a Red rubber catheter.  A weighted speculum was placed in the posterior vagina.  The cervix was grasped with a single tooth tenaculum, advanced into the operative field.  The portio was then injected circumferentially with a 1:100,000 solution of epinephrine.  Cervix was then circumscribed with a scalpel and the mucosa advanced off of the cervix using a finger and a Raytec sponge.  Posterior vaginal mucosa was tensioned down and the cul-de-sac peritoneum was entered sharply using curved Mayo scissors.  The uterosacral ligaments were clamped, cut, and suture ligated bilaterally and tagged.  Cardinal ligaments were similarly clamped, cut, and suture ligated bilaterally.  Some  difficulty was encountered in entering the anterior peritoneal reflection felt due to some bladder scarring from the patients one cesarean section.  Careful dissection was performed and eventually the peritoneal cavity was entered.  There was no obvious injury or entry into the bladder.  The uterine vessels were then clamped bilaterally, cut, and suture ligated.  The uterine fundus was then flipped posteriorly and curved Heaney clamps placed across the proximal adnexal pedicles bilaterally.  The specimen was then excised and sent to pathology.  The adnexal pedicles were serially ligated with two free ties of a stitch of 0 Vicryl.  Both tubes were status post tubal ligation.  Both ovaries were within normal limits.  There was no scarring of the adnexa noted.  There was some evidence of old endometriosis at the proximal fallopian tubes at the site of the tubal ligation with some brownish stippling noted.  At this point a McCall suture was placed through the vaginal mucosa to plicate the posterior peritoneum to prevent enterocele formation.  Posterior vaginal cuff was then reefed to the posterior peritoneum with improvement in hemostasis.  The adnexal pedicles were noted to have excellent hemostasis and were released in the peritoneal cavity.  The parietoperitoneum was then closed in a purse string fashion.  Counts were correct prior to closure of the peritoneum.  The uterosacral tags were then tied in the midline.  The vaginal cuff was then closed from side to side using interrupted figure-of-eight sutures of 0 Vicryl.  The McCall suture was then tied at the conclusion of the procedure.  The bladder was catheterized at the conclusion of the less than one hour procedure for 150 cc of clear urine which had accumulated during the procedure.  Hemostasis was noted to be excellent.  COUNTS:                       Correct x2.  FLUIDS:                       1100 cc Crystalloid.  ESTIMATED BLOOD  LOSS:         100 cc.  COMPLICATIONS:                None.   DISPOSITION:                  The patient was stable and drowsy, but arousable on  transfer to the recovery room. Dictated by:   Laqueta Linden, M.D. Attending Physician:  Jenean Lindau DD:  11/26/01 TD:  11/28/01 Job: [id number]

## 2010-11-26 NOTE — Op Note (Signed)
Cynthia Alexander, STOKES               ACCOUNT NO.:  [id number]   MEDICAL RECORD NO.:  [id number]          PATIENT TYPE:  AMB   LOCATION:  ENDO                         FACILITY:  [facility]   PHYSICIAN:  Anselmo Rod, M.D.  DATE OF BIRTH:  09/09/1955   DATE OF PROCEDURE:  10/03/2005  DATE OF DISCHARGE:                                 OPERATIVE REPORT   PROCEDURE PERFORMED:  Esophagogastroduodenoscopy with multiple gastric and  small-bowel biopsies.   ENDOSCOPIST:  Anselmo Rod, M.D.   INSTRUMENT USED:  Olympus video panendoscope.   INDICATIONS FOR PROCEDURE:  A 55 year old white female with a family history  of celiac sprue in the daughter, undergoing an EGD to biopsy the small-  bowel, rule out villous blunting.   PREPROCEDURE PREPARATION:  Informed consent was procured from the patient.  The patient fasted for four hours prior to the procedure.   PREPROCEDURE PHYSICAL EXAMINATION:  VITAL SIGNS:  Stable.  NECK:  Supple.  CHEST:  Clear to auscultation.  CARDIOVASCULAR:  S1 and S2 regular.  ABDOMEN:  Soft with normal bowel sounds.   DESCRIPTION OF PROCEDURE:  The patient was placed in left lateral decubitus  position, sedated with Fentanyl and Versed.  Once the patient was adequately  sedated and maintained on low flow oxygen and continuous cardiac monitoring,  the Olympus video panendoscope was advanced through the mouthpiece, over the  tongue and into the esophagus under direct vision.  The entire esophagus  appeared normal with no evidence of  ring, stricture masses, esophagitis or  Barrett's mucosa.  The scope was then advanced in the stomach.  Diffuse  gastritis was noted.  Biopsies were done to rule out the presence of H.  pylori by pathology.  Retroflexion in the high cardia revealed no evidence  of hiatal hernia.  The proximal small-bowel appeared normal.  Small-bowel  biopsies were done to rule out sprue.  The patient tolerated the procedure  well without  complications.   IMPRESSION:  1.  Normal-appearing esophagus.  2.  Diffuse gastritis, gastric biopsies done to rule out Helicobacter      pylori.  3.  Small-bowel biopsies done to rule out sprue.   RECOMMENDATIONS:  1.  Await pathology results.  2.  Avoid all nonsteroidals for now.  3.  Proceed with a colonoscopy at this time.  Further recommendations will      be made thereafter.      Anselmo Rod, M.D.  Electronically Signed     JNM/MEDQ  D:  10/03/2005  T:  10/04/2005  Job:  [id number]   cc:   M. Leda Quail, MD   C. Duane Lope, M.D.  Fax: [phone]

## 2010-11-26 NOTE — Op Note (Signed)
Cynthia Alexander, Cynthia Alexander               ACCOUNT NO.:  [id number]   MEDICAL RECORD NO.:  [id number]          PATIENT TYPE:  AMB   LOCATION:  ENDO                         FACILITY:  [facility]   PHYSICIAN:  Anselmo Rod, M.D.  DATE OF BIRTH:  Dec 19, 1955   DATE OF PROCEDURE:  10/04/2005  DATE OF DISCHARGE:                                 OPERATIVE REPORT   PROCEDURE:  Colonoscopy changed to flexible sigmoidoscopy.   ENDOSCOPIST:  Charna Elizabeth, M.D.   INSTRUMENT USED:  Olympus video colonoscope.   INDICATIONS FOR PROCEDURE:  55 year old white female undergoing screening  colonoscopy to rule out colonic polyps, masses, etc.  The patient has a  history of chronic constipation and has been on MiraLax for four days prior  to starting Osmo prep pills.  She has also had a recent change in bowel  habits with worsening constipation in the last few months.   PREPROCEDURE PREPARATION:  Informed consent was obtained from the patient.  The patient was fasted for four hours prior to the procedure and prepped  with Osmo prep the night of and the morning of the procedure.  The risks and  benefits of the procedure including a 10% miss rate of cancer and polyps  were discussed with her, as well.   PREPROCEDURE PHYSICAL:  Patient with stable vital signs.  Neck supple.  Chest clear to auscultation.  S1 and S2 regular.  Abdomen soft with normal  bowel sounds.   DESCRIPTION OF PROCEDURE:  The patient was placed in the left lateral  decubitus position, sedated with an additional 70 mcg Fentanyl and 1 mg  Versed in slow incremental doses.  Once the patient was adequately sedated,  maintained on low flow oxygen and continuous cardiac monitoring, the Olympus  video colonoscope was advanced from the rectum to 20 cm with difficulty.  The scope could not be advanced beyond this point as there was solid stool  in the colon.  The procedure was aborted at this point with plans to reprep  the patient and repeat the  procedure tomorrow.   IMPRESSION:  A large amount of solid stool in the colon, procedure aborted  at 20 cm.   RECOMMENDATIONS:  The patient will be re-prepped with Golytely tonight and  Dr. Elnoria Howard will perform the colonoscopy on her tomorrow.      Anselmo Rod, M.D.  Electronically Signed     JNM/MEDQ  D:  10/04/2005  T:  10/05/2005  Job:  [id number]   cc:   M. Leda Quail, MD   C. Duane Lope, M.D.  Fax: [phone]

## 2011-10-19 ENCOUNTER — Other Ambulatory Visit: Payer: Self-pay | Admitting: Family Medicine

## 2011-10-19 DIAGNOSIS — Z1231 Encounter for screening mammogram for malignant neoplasm of breast: Secondary | ICD-10-CM

## 2011-11-18 ENCOUNTER — Ambulatory Visit: Payer: BC Managed Care – PPO

## 2011-11-23 ENCOUNTER — Ambulatory Visit
Admission: RE | Admit: 2011-11-23 | Discharge: 2011-11-23 | Disposition: A | Discharge status: Home / Self Care | Payer: BC Managed Care – PPO | Source: Ambulatory Visit | Attending: Family Medicine | Admitting: Family Medicine

## 2011-11-23 DIAGNOSIS — Z1231 Encounter for screening mammogram for malignant neoplasm of breast: Secondary | ICD-10-CM

## 2012-12-14 ENCOUNTER — Encounter: Payer: Self-pay | Admitting: *Deleted

## 2012-12-20 ENCOUNTER — Ambulatory Visit: Payer: Self-pay | Admitting: Nurse Practitioner

## 2013-03-19 ENCOUNTER — Ambulatory Visit (INDEPENDENT_AMBULATORY_CARE_PROVIDER_SITE_OTHER): Payer: BC Managed Care – PPO | Admitting: Nurse Practitioner

## 2013-03-19 ENCOUNTER — Encounter: Payer: Self-pay | Admitting: Nurse Practitioner

## 2013-03-19 VITALS — BP 130/80 | HR 60 | Resp 16 | Ht 64.0 in | Wt 142.0 lb

## 2013-03-19 DIAGNOSIS — Z01419 Encounter for gynecological examination (general) (routine) without abnormal findings: Secondary | ICD-10-CM

## 2013-03-19 DIAGNOSIS — M899 Disorder of bone, unspecified: Secondary | ICD-10-CM

## 2013-03-19 DIAGNOSIS — M8580 Other specified disorders of bone density and structure, unspecified site: Secondary | ICD-10-CM

## 2013-03-19 DIAGNOSIS — Z0000 Encounter for general adult medical examination without abnormal findings: Secondary | ICD-10-CM

## 2013-03-19 LAB — POCT URINALYSIS DIPSTICK
Bilirubin, UA: NEGATIVE
Glucose, UA: NEGATIVE
Ketones, UA: NEGATIVE
Leukocytes, UA: NEGATIVE
Nitrite, UA: NEGATIVE
Protein, UA: NEGATIVE
Urobilinogen, UA: NEGATIVE
pH, UA: 7

## 2013-03-19 LAB — HEMOGLOBIN, FINGERSTICK: Hemoglobin, fingerstick: 12.9 g/dL (ref 12.0–16.0)

## 2013-03-19 MED ORDER — VITAMIN D (ERGOCALCIFEROL) 1.25 MG (50000 UNIT) PO CAPS: 50000.0000 [IU] | ORAL_CAPSULE | ORAL | Status: DC

## 2013-03-19 NOTE — Patient Instructions (Addendum)

## 2013-03-19 NOTE — Progress Notes (Signed)
Patient ID: Cynthia Alexander, female   DOB: Nov 15, 1955, 57 y.o.   MRN: [id number] 57 y.o. G5P5 Married Caucasian Fe here for annual exam.  No new health problems. Family is now increased with  Her daughter who had twin girls born 2 months ago.  No LMP recorded. Patient has had a hysterectomy.          Sexually active: yes  The current method of family planning is status post hysterectomy.    Exercising: yes  Home exercise routine includes yoga and walking. Smoker:  no  Health Maintenance: Pap:  07/24/2001, WNL MMG:  11/23/11, BI-Rads 1: negative Colonoscopy & EDG:  09/2005 Celiac  BMD:   11/17/10   T Score:  Spine -2.0 / left femur neck -0.3 TDaP:  2006  Labs: HB:  12.9 Urine: trace RBC, pH 7.0   reports that she has never smoked. She has never used smokeless tobacco. She reports that she does not drink alcohol or use illicit drugs.  Past Medical History  Diagnosis Date  . Celiac sprue   . BRCA negative     Past Surgical History  Procedure Laterality Date  . Abdominal hysterectomy      TVH  . Femoral hernia repair    . Cesarean section  1987  . Vaginal delivery      x4  . Colonoscopy      Current Outpatient Prescriptions  Medication Sig Dispense Refill  . ALPRAZolam (XANAX) 0.25 MG tablet Take 0.25 mg by mouth at bedtime as needed for sleep.      Marland Kitchen buPROPion (WELLBUTRIN XL) 150 MG 24 hr tablet Take 150 mg by mouth daily.      . calcium-vitamin D (OSCAL WITH D) 500-200 MG-UNIT per tablet Take 1 tablet by mouth daily.      Marland Kitchen diltiazem (TIAZAC) 360 MG 24 hr capsule Take 360 mg by mouth daily.      Marland Kitchen docusate sodium (COLACE) 100 MG capsule Take 100 mg by mouth 2 (two) times daily.      Marland Kitchen glucosamine-chondroitin 500-400 MG tablet Take 1 tablet by mouth 3 (three) times daily.      . Multiple Vitamin (MULTIVITAMIN) tablet Take 1 tablet by mouth daily.      . multivitamin-lutein (OCUVITE-LUTEIN) CAPS Take 1 capsule by mouth daily.      Marland Kitchen PARoxetine (PAXIL) 10 MG tablet Take 10 mg by  mouth every morning.      . traZODone (DESYREL) 100 MG tablet Take 100 mg by mouth at bedtime.      . triamterene-hydrochlorothiazide (DYAZIDE) 37.5-25 MG per capsule Take 1 capsule by mouth every morning.      . Vitamin D, Ergocalciferol, (DRISDOL) 50000 UNITS CAPS Take 50,000 Units by mouth every 30 (thirty) days.        No current facility-administered medications for this visit.    Family History  Problem Relation Age of Onset  . Breast cancer Mother   . Breast cancer Maternal Aunt     ROS:  Pertinent items are noted in HPI.  Otherwise, a comprehensive ROS was negative.  Exam:   BP 130/80  Pulse 60  Resp 16  Ht 5\' 4"  (1.626 m)  Wt 142 lb (64.411 kg)  BMI 24.36 kg/m2 Height: 5\' 4"  (162.6 cm)  Ht Readings from Last 3 Encounters:  03/19/13 5\' 4"  (1.626 m)    General appearance: alert, cooperative and appears stated age Head: Normocephalic, without obvious abnormality, atraumatic Neck: no adenopathy, supple, symmetrical, trachea midline and  thyroid normal to inspection and palpation Lungs: clear to auscultation bilaterally Breasts: normal appearance, no masses or tenderness Heart: regular rate and rhythm Abdomen: soft, non-tender; no masses,  no organomegaly Extremities: extremities normal, atraumatic, no cyanosis or edema Skin: Skin color, texture, turgor normal. No rashes or lesions Lymph nodes: Cervical, supraclavicular, and axillary nodes normal. No abnormal inguinal nodes palpated Neurologic: Grossly normal   Pelvic: External genitalia:  no lesions              Urethra:  normal appearing urethra with no masses, tenderness or lesions              Bartholin's and Skene's: normal                 Vagina: normal appearing vagina with normal color and discharge, no lesions              Cervix: absent              Pap taken: no Bimanual Exam:  Uterus:  uterus absent              Adnexa: no mass, fullness, tenderness               Rectovaginal: Confirms                Anus:  normal sphincter tone, no lesions  A:  Well Woman with normal exam  S/P TVH 2003 secondary to endometriosis  History of Vit D deficiency  [facility] of breast cancer with mother at age 27 - pt is BRCA negative 01/2010  Osteopenia  Situational anxiety  History of Celiac sprue  P:   Pap smear as per guidelines -Not indicated  Mammogram will be scheduled this month  Refill Vit D RX - will follow with labs  Will get repeat colonoscopy with Dr. Loreta Ave  Denied request for Xanax - must get from PCP  Order placed for BMD and Mammo  Counseled on breast self exam, osteoporosis, adequate intake of calcium and vitamin D, diet and exercise return annually or prn  An After Visit Summary was printed and given to the patient.

## 2013-03-20 ENCOUNTER — Encounter: Payer: Self-pay | Admitting: Nurse Practitioner

## 2013-03-20 LAB — VITAMIN D 25 HYDROXY (VIT D DEFICIENCY, FRACTURES): Vit D, 25-Hydroxy: 55 ng/mL (ref 30–89)

## 2013-03-21 ENCOUNTER — Encounter: Payer: Self-pay | Admitting: Nurse Practitioner

## 2013-03-21 NOTE — Progress Notes (Signed)
Encounter reviewed by Dr. Lukasz Rogus Silva.  

## 2013-04-18 ENCOUNTER — Ambulatory Visit
Admission: RE | Admit: 2013-04-18 | Discharge: 2013-04-18 | Disposition: A | Discharge status: Home / Self Care | Payer: BC Managed Care – PPO | Source: Ambulatory Visit | Attending: Nurse Practitioner | Admitting: Nurse Practitioner

## 2013-04-18 DIAGNOSIS — M8580 Other specified disorders of bone density and structure, unspecified site: Secondary | ICD-10-CM

## 2013-04-18 DIAGNOSIS — Z0000 Encounter for general adult medical examination without abnormal findings: Secondary | ICD-10-CM

## 2013-04-26 ENCOUNTER — Telehealth: Payer: Self-pay | Admitting: Nurse Practitioner

## 2013-04-26 NOTE — Telephone Encounter (Signed)
Left patient a note that I wanted to discuss her BMD report. She is to call back and speak with myself or Judeth Cornfield.

## 2013-05-03 ENCOUNTER — Telehealth: Payer: Self-pay | Admitting: Nurse Practitioner

## 2013-05-03 NOTE — Telephone Encounter (Signed)
Discussed with patient about BMD results after consulting with Dr. Hyacinth Meeker.  She wanted to maybe go on HRT but has been menopausal for several years.  She also has a [facility] of breast cancer with mother at age 58.  She had BRCA testing which was negative for I/ II.  She still has a risk of 4.9 % by age 62 and 13 % risk for life.  Dr. Rondel Baton other suggestion was for Evista which would help with BMD and reduction of breast cancer risk.  Offered patient an appointment to discuss with Dr. Hyacinth Meeker.  She will call back next week and schedule.

## 2013-05-21 ENCOUNTER — Encounter: Payer: Self-pay | Admitting: Nurse Practitioner

## 2013-12-08 ENCOUNTER — Encounter (HOSPITAL_BASED_OUTPATIENT_CLINIC_OR_DEPARTMENT_OTHER): Payer: Self-pay | Admitting: Emergency Medicine

## 2013-12-08 ENCOUNTER — Emergency Department (HOSPITAL_BASED_OUTPATIENT_CLINIC_OR_DEPARTMENT_OTHER)
Admission: EM | Admit: 2013-12-08 | Discharge: 2013-12-09 | Disposition: A | Discharge status: Home / Self Care | Payer: BC Managed Care – PPO | Attending: Emergency Medicine | Admitting: Emergency Medicine

## 2013-12-08 DIAGNOSIS — Z8739 Personal history of other diseases of the musculoskeletal system and connective tissue: Secondary | ICD-10-CM | POA: Insufficient documentation

## 2013-12-08 DIAGNOSIS — Z8719 Personal history of other diseases of the digestive system: Secondary | ICD-10-CM | POA: Insufficient documentation

## 2013-12-08 DIAGNOSIS — Z79899 Other long term (current) drug therapy: Secondary | ICD-10-CM | POA: Insufficient documentation

## 2013-12-08 DIAGNOSIS — G454 Transient global amnesia: Secondary | ICD-10-CM | POA: Insufficient documentation

## 2013-12-08 DIAGNOSIS — IMO0002 Reserved for concepts with insufficient information to code with codable children: Secondary | ICD-10-CM

## 2013-12-08 DIAGNOSIS — F5231 Female orgasmic disorder: Secondary | ICD-10-CM

## 2013-12-08 LAB — COMPREHENSIVE METABOLIC PANEL
ALT: 19 U/L (ref 0–35)
AST: 23 U/L (ref 0–37)
Albumin: 4.6 g/dL (ref 3.5–5.2)
Alkaline Phosphatase: 92 U/L (ref 39–117)
BUN: 15 mg/dL (ref 6–23)
CO2: 29 mEq/L (ref 19–32)
Calcium: 10 mg/dL (ref 8.4–10.5)
Chloride: 99 mEq/L (ref 96–112)
Creatinine, Ser: 0.9 mg/dL (ref 0.50–1.10)
GFR calc Af Amer: 80 mL/min — ABNORMAL LOW (ref >90)
GFR calc non Af Amer: 69 mL/min — ABNORMAL LOW (ref >90)
Glucose, Bld: 99 mg/dL (ref 70–99)
Potassium: 3.2 mEq/L — ABNORMAL LOW (ref 3.7–5.3)
Sodium: 141 mEq/L (ref 137–147)
Total Bilirubin: <0.2 mg/dL — ABNORMAL LOW (ref 0.3–1.2)
Total Protein: 8 g/dL (ref 6.0–8.3)

## 2013-12-08 LAB — CBC WITH DIFFERENTIAL/PLATELET
Basophils Absolute: 0.1 10*3/uL (ref 0.0–0.1)
Basophils Relative: 2 % — ABNORMAL HIGH (ref 0–1)
Eosinophils Absolute: 0.3 10*3/uL (ref 0.0–0.7)
Eosinophils Relative: 5 % (ref 0–5)
HCT: 40 % (ref 36.0–46.0)
Hemoglobin: 13.9 g/dL (ref 12.0–15.0)
Lymphocytes Relative: 32 % (ref 12–46)
Lymphs Abs: 2.2 10*3/uL (ref 0.7–4.0)
MCH: 32.2 pg (ref 26.0–34.0)
MCHC: 34.8 g/dL (ref 30.0–36.0)
MCV: 92.6 fL (ref 78.0–100.0)
Monocytes Absolute: 0.7 10*3/uL (ref 0.1–1.0)
Monocytes Relative: 11 % (ref 3–12)
Neutro Abs: 3.4 10*3/uL (ref 1.7–7.7)
Neutrophils Relative %: 51 % (ref 43–77)
Platelets: 283 10*3/uL (ref 150–400)
RBC: 4.32 MIL/uL (ref 3.87–5.11)
RDW: 13.2 % (ref 11.5–15.5)
WBC: 6.7 10*3/uL (ref 4.0–10.5)

## 2013-12-08 MED ORDER — LORAZEPAM 2 MG/ML IJ SOLN
1.0000 mg | Freq: Once | INTRAMUSCULAR | Status: AC
  Administered 2013-12-08: 1 mg via INTRAVENOUS
  Filled 2013-12-08: qty 1

## 2013-12-08 MED ORDER — MORPHINE SULFATE 4 MG/ML IJ SOLN
4.0000 mg | Freq: Once | INTRAMUSCULAR | Status: DC
  Filled 2013-12-08: qty 1

## 2013-12-08 MED ORDER — SODIUM CHLORIDE 0.9 % IV SOLN
Freq: Once | INTRAVENOUS | Status: AC
  Administered 2013-12-08: 23:00:00 via INTRAVENOUS

## 2013-12-08 NOTE — ED Notes (Signed)
Pt admits to having continued sexual arrousement during triage

## 2013-12-08 NOTE — ED Notes (Signed)
Pt reports slight memory lost after sexual activity but states is returning now

## 2013-12-08 NOTE — ED Provider Notes (Signed)
CSN: [id number]     Arrival date & time 12/08/13  2221 History   First MD Initiated Contact with Patient 12/08/13 2252     Chief Complaint  Patient presents with  . Altered Mental Status     (Consider location/radiation/quality/duration/timing/severity/associated sxs/prior Treatment) Patient is a 58 y.o. female presenting with altered mental status. The history is provided by the patient. No language interpreter was used.  Altered Mental Status Presenting symptoms: memory loss   Severity:  Moderate Episode history:  Single Duration:  30 minutes Timing:  Constant Progression:  Worsening Chronicity:  New Context: not drug use, not head injury, taking medications as prescribed, not a recent change in medication, not a recent illness and not a recent infection   Associated symptoms: no abdominal pain   Pt reports she had memory loss to the days events that lasted for about 30 minutes.    Past Medical History  Diagnosis Date  . Celiac sprue 3/07  . BRCA negative 01/2010    BRCA I/ II negative, done secondary to mother's history of Breast Cancer at age 31  . Osteopenia    Past Surgical History  Procedure Laterality Date  . Femoral hernia repair Right 07/2004  . Cesarean section  1987  . Vaginal delivery      x4  . Colonoscopy  10/04/05    celiac  . Esophagogastroduodenoscopy endoscopy  10/04/05    Biopsy shows celiac  . Vaginal hysterectomy  11/27/2001    secondary to adenomyosis, ovaries remain   Family History  Problem Relation Age of Onset  . Breast cancer Mother 53    second time postmenopausal  . Breast cancer Maternal Aunt     postmenopausal  . Prostate cancer Father   . Hypertension Brother    History  Substance Use Topics  . Smoking status: Never Smoker   . Smokeless tobacco: Never Used  . Alcohol Use: No   OB History   Grav Para Term Preterm Abortions TAB SAB Ect Mult Living   '5 5        5     ' Review of Systems  Gastrointestinal: Negative for abdominal  pain.  Psychiatric/Behavioral: Positive for memory loss.  All other systems reviewed and are negative.     Allergies  Review of patient's allergies indicates no known allergies.  Home Medications   Prior to Admission medications   Medication Sig Start Date End Date Taking? Authorizing Provider  ALPRAZolam Duanne Moron) 0.25 MG tablet Take 0.25 mg by mouth at bedtime as needed for sleep.    Historical Provider, MD  buPROPion (WELLBUTRIN XL) 150 MG 24 hr tablet Take 150 mg by mouth daily.    Historical Provider, MD  calcium-vitamin D (OSCAL WITH D) 500-200 MG-UNIT per tablet Take 1 tablet by mouth daily.    Historical Provider, MD  diltiazem (TIAZAC) 360 MG 24 hr capsule Take 360 mg by mouth daily.    Historical Provider, MD  docusate sodium (COLACE) 100 MG capsule Take 100 mg by mouth 2 (two) times daily.    Historical Provider, MD  glucosamine-chondroitin 500-400 MG tablet Take 1 tablet by mouth 3 (three) times daily.    Historical Provider, MD  Multiple Vitamin (MULTIVITAMIN) tablet Take 1 tablet by mouth daily.    Historical Provider, MD  multivitamin-lutein [facility]) CAPS Take 1 capsule by mouth daily.    Historical Provider, MD  PARoxetine (PAXIL) 10 MG tablet Take 10 mg by mouth every morning.    Historical Provider, MD  traZODone (  DESYREL) 100 MG tablet Take 100 mg by mouth at bedtime.    Historical Provider, MD  triamterene-hydrochlorothiazide (DYAZIDE) 37.5-25 MG per capsule Take 1 capsule by mouth every morning.    Historical Provider, MD  Vitamin D, Ergocalciferol, (DRISDOL) 50000 UNITS CAPS capsule Take 1 capsule (50,000 Units total) by mouth every 7 (seven) days. 03/19/13   Mardene Celeste Rolen-Grubb, FNP   BP 167/80  Pulse 62  Temp(Src) 98.3 F (36.8 C) (Oral)  Resp 18  SpO2 98% Physical Exam  Nursing note and vitals reviewed. Constitutional: She is oriented to person, place, and time. She appears well-developed and well-nourished.  HENT:  Head: Normocephalic.  Eyes: EOM  are normal.  Neck: Normal range of motion.  Pulmonary/Chest: Effort normal.  Abdominal: Soft. She exhibits no distension.  Musculoskeletal: Normal range of motion.  Neurological: She is alert and oriented to person, place, and time.  Skin: Skin is warm.  Psychiatric: She has a normal mood and affect.    ED Course  Procedures (including critical care time) Labs Review Labs Reviewed  CBC WITH DIFFERENTIAL - Abnormal; Notable for the following:    Basophils Relative 2 (*)    All other components within normal limits  COMPREHENSIVE METABOLIC PANEL  URINALYSIS, ROUTINE W REFLEX MICROSCOPIC    Imaging Review No results found.   EKG Interpretation None     Results for orders placed during the hospital encounter of 12/08/13  CBC WITH DIFFERENTIAL      Result Value Ref Range   WBC 6.7  4.0 - 10.5 K/uL   RBC 4.32  3.87 - 5.11 MIL/uL   Hemoglobin 13.9  12.0 - 15.0 g/dL   HCT 40.0  36.0 - 46.0 %   MCV 92.6  78.0 - 100.0 fL   MCH 32.2  26.0 - 34.0 pg   MCHC 34.8  30.0 - 36.0 g/dL   RDW 13.2  11.5 - 15.5 %   Platelets 283  150 - 400 K/uL   Neutrophils Relative % 51  43 - 77 %   Neutro Abs 3.4  1.7 - 7.7 K/uL   Lymphocytes Relative 32  12 - 46 %   Lymphs Abs 2.2  0.7 - 4.0 K/uL   Monocytes Relative 11  3 - 12 %   Monocytes Absolute 0.7  0.1 - 1.0 K/uL   Eosinophils Relative 5  0 - 5 %   Eosinophils Absolute 0.3  0.0 - 0.7 K/uL   Basophils Relative 2 (*) 0 - 1 %   Basophils Absolute 0.1  0.0 - 0.1 K/uL  COMPREHENSIVE METABOLIC PANEL      Result Value Ref Range   Sodium 141  137 - 147 mEq/L   Potassium 3.2 (*) 3.7 - 5.3 mEq/L   Chloride 99  96 - 112 mEq/L   CO2 29  19 - 32 mEq/L   Glucose, Bld 99  70 - 99 mg/dL   BUN 15  6 - 23 mg/dL   Creatinine, Ser 0.90  0.50 - 1.10 mg/dL   Calcium 10.0  8.4 - 10.5 mg/dL   Total Protein 8.0  6.0 - 8.3 g/dL   Albumin 4.6  3.5 - 5.2 g/dL   AST 23  0 - 37 U/L   ALT 19  0 - 35 U/L   Alkaline Phosphatase 92  39 - 117 U/L   Total  Bilirubin <0.2 (*) 0.3 - 1.2 mg/dL   GFR calc non Af Amer 69 (*) >90 mL/min   GFR calc Af  Amer 80 (*) >90 mL/min  URINALYSIS, ROUTINE W REFLEX MICROSCOPIC      Result Value Ref Range   Color, Urine COLORLESS (*) YELLOW   APPearance CLEAR  CLEAR   Specific Gravity, Urine 1.006  1.005 - 1.030   pH 7.5  5.0 - 8.0   Glucose, UA NEGATIVE  NEGATIVE mg/dL   Hgb urine dipstick TRACE (*) NEGATIVE   Bilirubin Urine NEGATIVE  NEGATIVE   Ketones, ur NEGATIVE  NEGATIVE mg/dL   Protein, ur NEGATIVE  NEGATIVE mg/dL   Urobilinogen, UA 0.2  0.0 - 1.0 mg/dL   Nitrite NEGATIVE  NEGATIVE   Leukocytes, UA NEGATIVE  NEGATIVE  URINE MICROSCOPIC-ADD ON      Result Value Ref Range   Squamous Epithelial / LPF RARE  RARE   WBC, UA 0-2  <3 WBC/hpf   RBC / HPF 0-2  <3 RBC/hpf   No results found.  MDM   Final diagnoses:  Amnesia, global, transient  Orgasm disorder    Pt given ativan iv.   Pt reports pelvic spasm stopped and she is relaxed now.   I spoke to Dr. Phineas Real who advised not gyn.     Fransico Meadow, PA-C 12/09/13 [place], PA-C 12/09/13 [phone]

## 2013-12-09 ENCOUNTER — Emergency Department (HOSPITAL_BASED_OUTPATIENT_CLINIC_OR_DEPARTMENT_OTHER): Payer: BC Managed Care – PPO

## 2013-12-09 LAB — URINALYSIS, ROUTINE W REFLEX MICROSCOPIC
Bilirubin Urine: NEGATIVE
Glucose, UA: NEGATIVE mg/dL
Ketones, ur: NEGATIVE mg/dL
Leukocytes, UA: NEGATIVE
Nitrite: NEGATIVE
Protein, ur: NEGATIVE mg/dL
Specific Gravity, Urine: 1.006 (ref 1.005–1.030)
Urobilinogen, UA: 0.2 mg/dL (ref 0.0–1.0)
pH: 7.5 (ref 5.0–8.0)

## 2013-12-09 LAB — URINE MICROSCOPIC-ADD ON

## 2013-12-09 IMAGING — CT CT HEAD W/O CM
1 series · 16 of 30 positions shown, 20 images · non-contrast
Comparison: None.

CLINICAL DATA: Altered mental status

EXAM:
CT HEAD WITHOUT CONTRAST
TECHNIQUE: Contiguous axial images were obtained from the base of the skull
through the vertex without intravenous contrast.

[Series 2: head 4.8 h37s · axial · 0.47mm/px · z∈[+1154,+1306]mm · 16 of 36 slices shown, 20 images]
[im 2/36  brain]
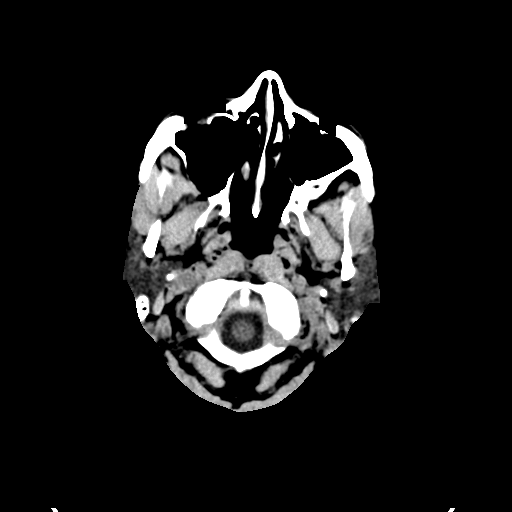
[im 2/36  bone]
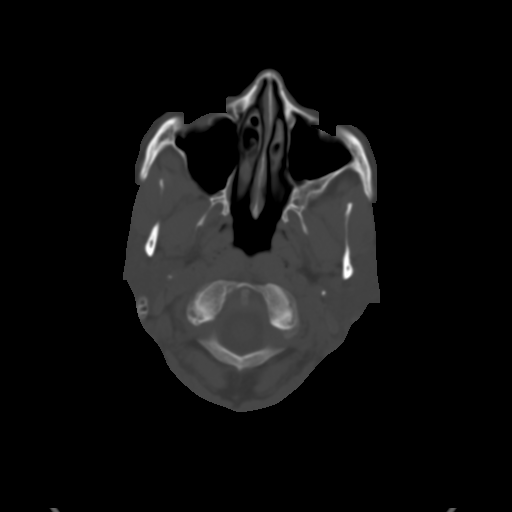
[im 4/36  brain]
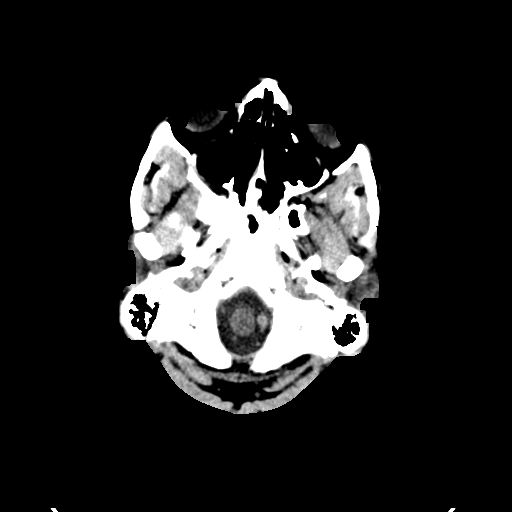
[im 7/36  brain]
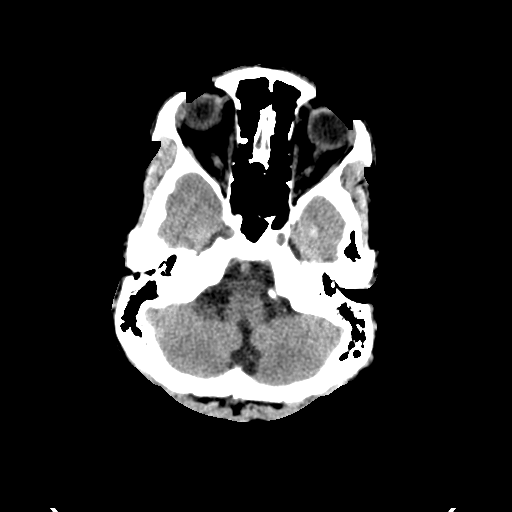
[im 9/36  brain]
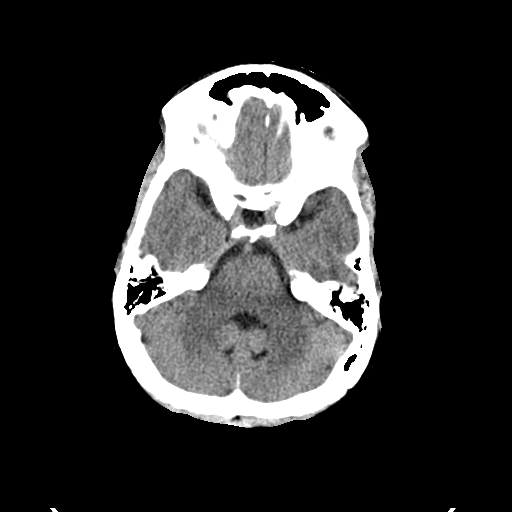
[im 10/36  brain]
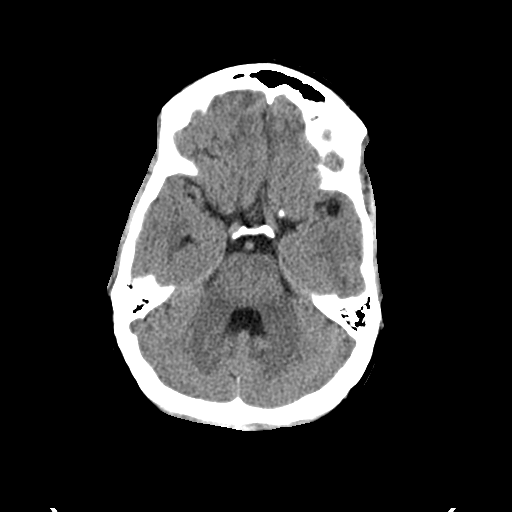
[im 10/36  bone]
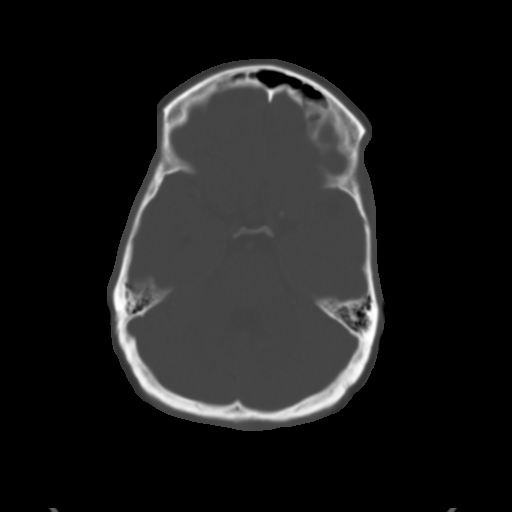
[im 13/36  brain]
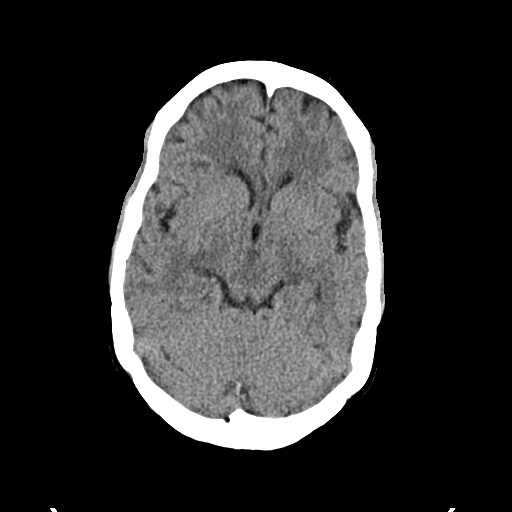
[im 15/36  brain]
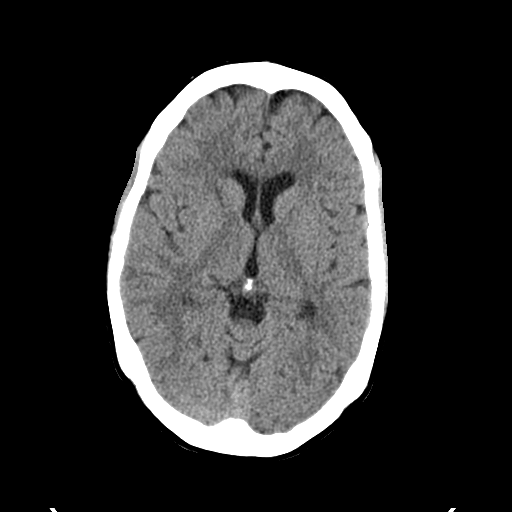
[im 17/36  brain]
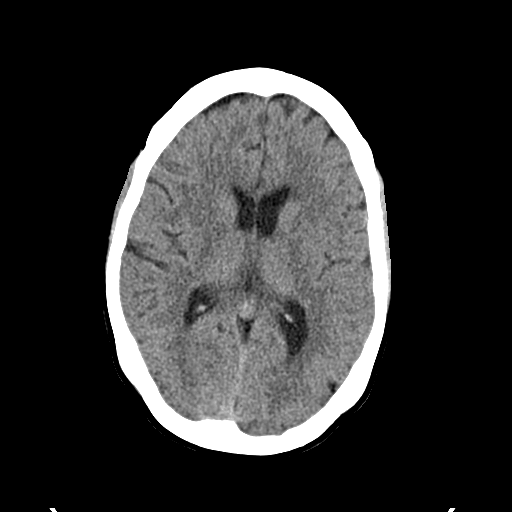
[im 19/36  brain]
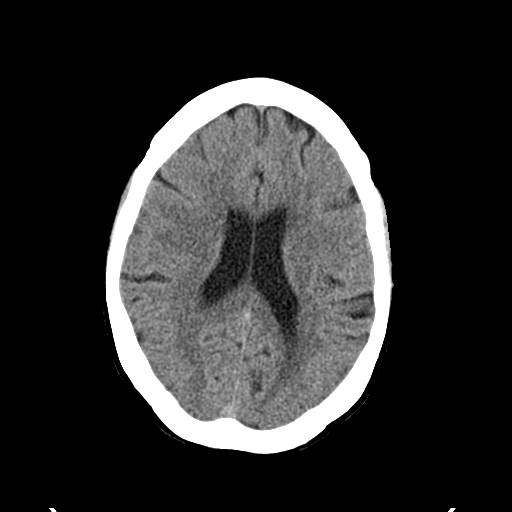
[im 19/36  bone]
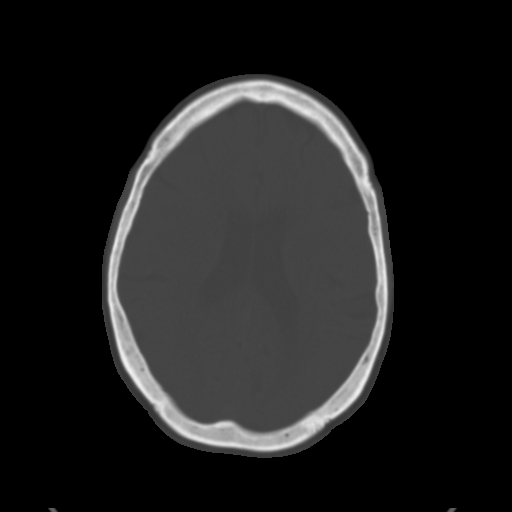
[im 21/36  brain]
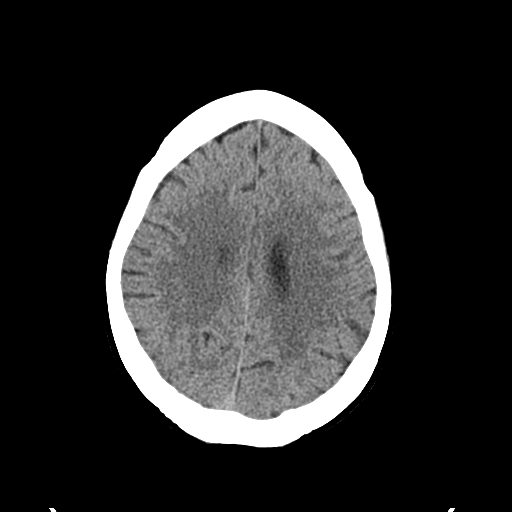
[im 23/36  brain]
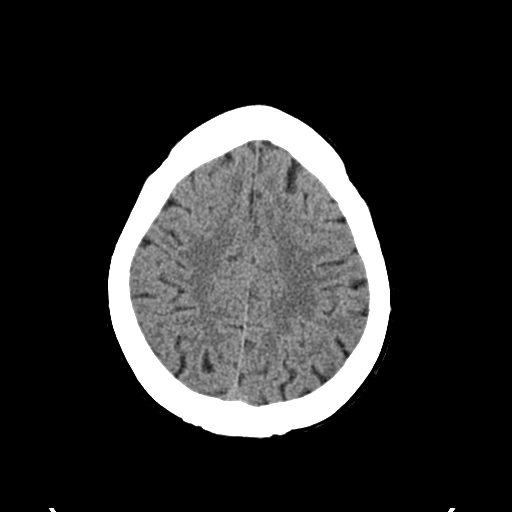
[im 26/36  brain]
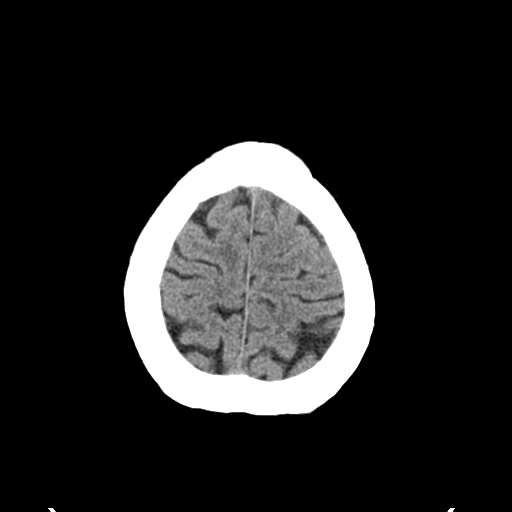
[im 27/36  brain]
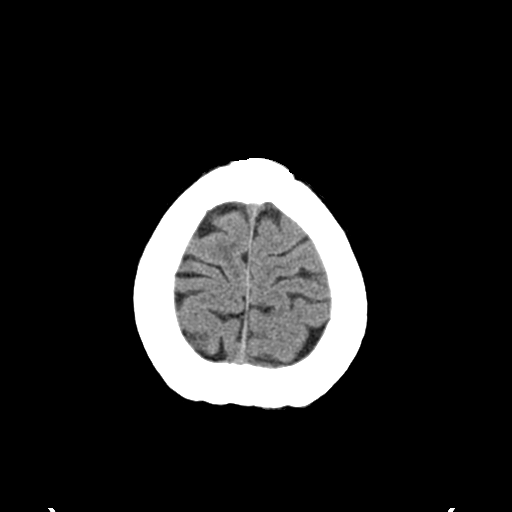
[im 27/36  bone]
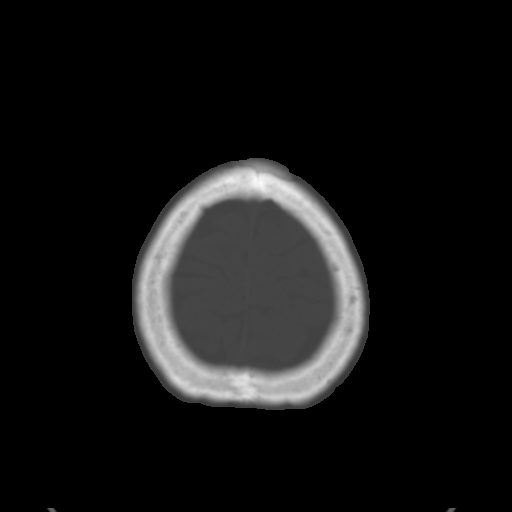
[im 29/36  brain]
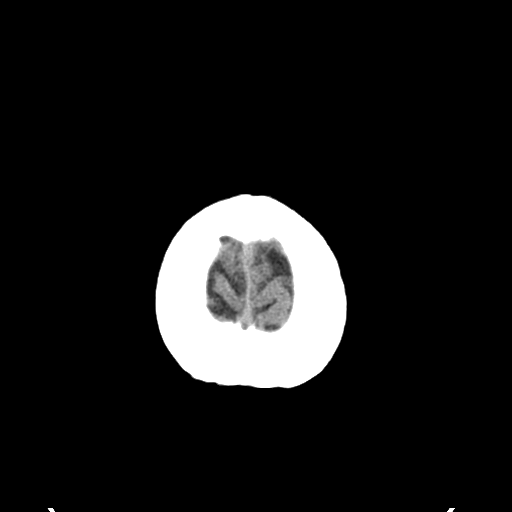
[im 32/36  brain]
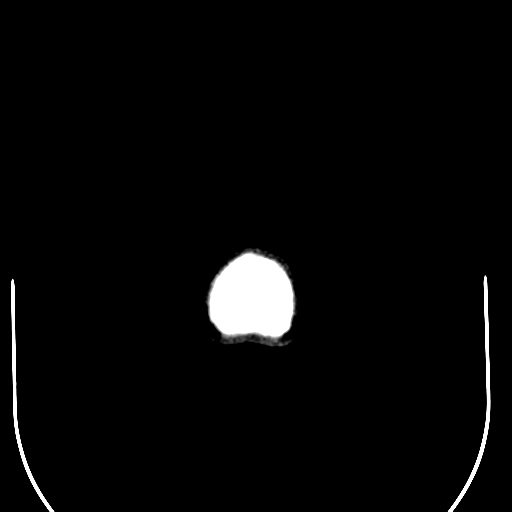
[im 34/36  brain]
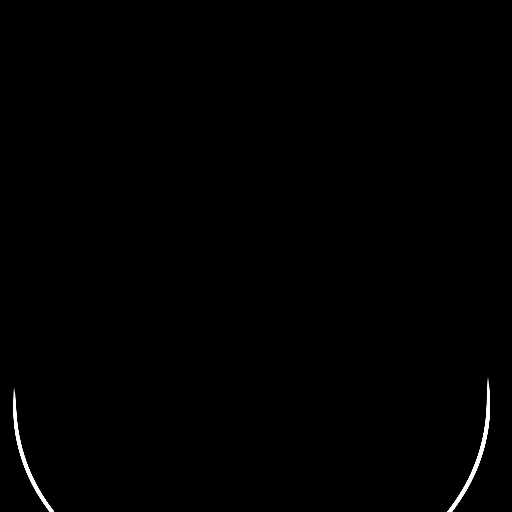

[16 of 30 positions shown; findings below may reference images not displayed]

FINDINGS: Skull and Sinuses:No fracture or destructive process. Few scattered
calvarial lucent areas which are most likely vascular/venous.

Orbits: No acute abnormality.

Brain: No evidence of acute abnormality, such as acute infarction,
hemorrhage, hydrocephalus, or mass lesion/mass effect. Mildly low
cerebral volume for age.
IMPRESSION: Negative head CT.

## 2013-12-09 MED ORDER — LORAZEPAM 1 MG PO TABS: 1.0000 mg | ORAL_TABLET | Freq: Three times a day (TID) | ORAL | Status: DC | PRN

## 2013-12-09 NOTE — ED Notes (Signed)
rx x 1 given for ativan- d/c home with ride

## 2013-12-09 NOTE — ED Provider Notes (Signed)
Medical screening examination/treatment/procedure(s) were performed by non-physician practitioner and as supervising physician I was immediately available for consultation/collaboration.      Wynetta Fines, MD 12/09/13 [phone]

## 2013-12-09 NOTE — Discharge Instructions (Signed)
Transient Global Amnesia Your exam shows you may have a rare problem that causes temporary amnesia, an inability to remember what has happened in the past several hours or day. Transient global amnesia (TGA) means you cannot remember recent events, even though you may look and act normally. There are no physical problems in TGA; your vision, strength, coordination, and sensations are all normal. TGA occurs most often in older patients, and in patients with high blood pressure. The exact cause of TGA is not known, although it is thought to be due to vascular disease in your brain. There is usually a complete return to normal memory capacity after an episode is over. About 20-30% of patients with TGA will have more than one episode, and some studies show a slight increased risk for stroke. Although no special treatment is needed, taking up to one adult aspirin daily reduces the risk of having a stroke. You should consider taking aspirin daily if you are not allergic to it. Medical evaluation may require specialized scans to check for stroke or other brain problems, an EEG (brain wave test), or blood tests. Avoid alcohol or any sedating medicines until you are completely recovered. Call your doctor right away if your memory is not fully recovered after 24 hours, or if you have any other serious problems including:  Severe headache, nausea, vomiting, fever, or other symptoms of an infection.  Weakness, numbness, difficulty with movement, or incoordination.  Blurred or double vision, unusual sleepiness, seizures, or fainting. Document Released: 08/04/2004 Document Revised: 09/19/2011 Document Reviewed: 06/27/2005 [facility] Patient Information 2014 [place].

## 2014-03-21 ENCOUNTER — Ambulatory Visit: Payer: BC Managed Care – PPO | Admitting: Nurse Practitioner

## 2014-03-25 ENCOUNTER — Encounter: Payer: Self-pay | Admitting: Nurse Practitioner

## 2014-03-25 ENCOUNTER — Ambulatory Visit: Payer: BC Managed Care – PPO | Admitting: Nurse Practitioner

## 2014-03-25 ENCOUNTER — Ambulatory Visit (INDEPENDENT_AMBULATORY_CARE_PROVIDER_SITE_OTHER): Payer: BC Managed Care – PPO | Admitting: Nurse Practitioner

## 2014-03-25 VITALS — BP 120/74 | HR 68 | Ht 64.0 in | Wt 144.0 lb

## 2014-03-25 DIAGNOSIS — Z1211 Encounter for screening for malignant neoplasm of colon: Secondary | ICD-10-CM

## 2014-03-25 DIAGNOSIS — Z0000 Encounter for general adult medical examination without abnormal findings: Secondary | ICD-10-CM

## 2014-03-25 DIAGNOSIS — Z01419 Encounter for gynecological examination (general) (routine) without abnormal findings: Secondary | ICD-10-CM

## 2014-03-25 LAB — POCT URINALYSIS DIPSTICK
Bilirubin, UA: NEGATIVE
Blood, UA: NEGATIVE
Glucose, UA: NEGATIVE
Ketones, UA: NEGATIVE
Leukocytes, UA: NEGATIVE
Nitrite, UA: NEGATIVE
Protein, UA: NEGATIVE
Urobilinogen, UA: NEGATIVE
pH, UA: 6

## 2014-03-25 NOTE — Progress Notes (Signed)
Patient ID: Cynthia Alexander, female   DOB: [birth year], 58 y.o.   MRN: [id number] 58 y.o. G76P5 Married Caucasian Fe here for annual exam.  Had an epsidoe of global memory loss and orgasmic disorder in May 2015.  She had not taken any new med's and after ED treatment symptoms improved.  ED states this sometimes is bought on by taking certain OTC med's but she had nothing different.  She feels well now.  She does have ongoing spells of constipation.  No flare of Celiac sprue.  She is due for a repeat colonoscpy and would like to postpone to next year.  Patient's last menstrual period was 11/08/2001.          Sexually active: yes  The current method of family planning is status post hysterectomy.  Exercising: yes Home exercise routine includes yoga and walking.  Smoker: no   Health Maintenance:  Pap: 07/24/2001, WNL  MMG: 04/18/13, BI-Rads 1: negative  Colonoscopy & EDG: 09/2005 Celiac  BMD: 04/18/13:  T Score: Spine -2.4 / left femur neck -0.7  TDaP: 2006  Labs:  HB:  12.1  Urine:  Negative    reports that she has never smoked. She has never used smokeless tobacco. She reports that she does not drink alcohol or use illicit drugs.  Past Medical History  Diagnosis Date  . Celiac sprue 3/07  . BRCA negative 01/2010    BRCA I/ II negative, done secondary to mother's history of Breast Cancer at age 63  . Osteopenia     Past Surgical History  Procedure Laterality Date  . Femoral hernia repair Right 07/2004  . Cesarean section  1987  . Vaginal delivery      x4  . Colonoscopy  10/04/05    celiac  . Esophagogastroduodenoscopy endoscopy  10/04/05    Biopsy shows celiac  . Vaginal hysterectomy  11/27/2001    secondary to adenomyosis, ovaries remain    Current Outpatient Prescriptions  Medication Sig Dispense Refill  . ALPRAZolam (XANAX) 0.25 MG tablet Take 0.25 mg by mouth at bedtime as needed for sleep.      Marland Kitchen buPROPion (WELLBUTRIN XL) 150 MG 24 hr tablet Take 150 mg by mouth daily.      .  calcium-vitamin D (OSCAL WITH D) 500-200 MG-UNIT per tablet Take 1 tablet by mouth daily.      Marland Kitchen diltiazem (TIAZAC) 360 MG 24 hr capsule Take 360 mg by mouth daily.      Marland Kitchen docusate sodium (COLACE) 100 MG capsule Take 100 mg by mouth 2 (two) times daily.      Marland Kitchen glucosamine-chondroitin 500-400 MG tablet Take 1 tablet by mouth 3 (three) times daily.      . Multiple Vitamin (MULTIVITAMIN) tablet Take 1 tablet by mouth daily.      . multivitamin-lutein (OCUVITE-LUTEIN) CAPS Take 2 capsules by mouth daily.       Marland Kitchen PARoxetine (PAXIL) 10 MG tablet Take 10 mg by mouth every morning.      . traZODone (DESYREL) 50 MG tablet Take 75 mg by mouth at bedtime.      . triamterene-hydrochlorothiazide (DYAZIDE) 37.5-25 MG per capsule Take 1 capsule by mouth every morning.      . Vitamin D, Ergocalciferol, (DRISDOL) 50000 UNITS CAPS capsule Take 1 capsule (50,000 Units total) by mouth every 7 (seven) days.  30 capsule  3  . diltiazem (CARDIZEM CD) 360 MG 24 hr capsule       . levothyroxine (SYNTHROID, LEVOTHROID) 50 MCG  tablet        No current facility-administered medications for this visit.    Family History  Problem Relation Age of Onset  . Breast cancer Mother 27    second time postmenopausal  . Breast cancer Maternal Aunt     postmenopausal  . Prostate cancer Father   . Hypertension Brother     ROS:  Pertinent items are noted in HPI.  Otherwise, a comprehensive ROS was negative.  Exam:   BP 120/74  Pulse 68  Ht '5\' 4"'  (1.626 m)  Wt 144 lb (65.318 kg)  BMI 24.71 kg/m2  LMP 11/08/2001 Height: '5\' 4"'  (162.6 cm)  Ht Readings from Last 3 Encounters:  03/25/14 '5\' 4"'  (1.626 m)  03/19/13 '5\' 4"'  (1.626 m)    General appearance: alert, cooperative and appears stated age Head: Normocephalic, without obvious abnormality, atraumatic Neck: no adenopathy, supple, symmetrical, trachea midline and thyroid normal to inspection and palpation Lungs: clear to auscultation bilaterally Breasts: normal  appearance, no masses or tenderness Heart: regular rate and rhythm Abdomen: soft, non-tender; no masses,  no organomegaly Extremities: extremities normal, atraumatic, no cyanosis or edema Skin: Skin color, texture, turgor normal. No rashes or lesions Lymph nodes: Cervical, supraclavicular, and axillary nodes normal. No abnormal inguinal nodes palpated Neurologic: Grossly normal   Pelvic: External genitalia:  no lesions              Urethra:  normal appearing urethra with no masses, tenderness or lesions              Bartholin's and Skene's: normal                 Vagina: normal appearing vagina with normal color and discharge, no lesions              Cervix: absent              Pap taken: No. Bimanual Exam:  Uterus:  uterus absent              Adnexa: no mass, fullness, tenderness               Rectovaginal: Confirms               Anus:  normal sphincter tone, no lesions  A:  Well Woman with normal exam  S/P TVH 2003 secondary to endometriosis   History of Vit D deficiency   [facility] of breast cancer with mother at age 63 - pt is BRCA negative 01/2010   Osteopenia - stable  Situational anxiety   History of Celiac sprue   P:   Reviewed health and wellness pertinent to exam  Pap smear not taken today  Mammogram is due 10/15  IFOB iis given  Counseled on breast self exam, mammography screening, adequate intake of calcium and vitamin D, diet and exercise return annually or prn  An After Visit Summary was printed and given to the patient.

## 2014-03-25 NOTE — Patient Instructions (Signed)

## 2014-03-26 LAB — HEMOGLOBIN, FINGERSTICK: Hemoglobin, fingerstick: 12.1 g/dL (ref 12.0–16.0)

## 2014-03-31 NOTE — Progress Notes (Signed)
Encounter reviewed by Dr. Brook Silva.  

## 2014-05-12 ENCOUNTER — Encounter: Payer: Self-pay | Admitting: Nurse Practitioner

## 2014-10-09 ENCOUNTER — Other Ambulatory Visit: Payer: Self-pay | Admitting: Nurse Practitioner

## 2014-10-09 NOTE — Telephone Encounter (Signed)
Medication refill request: Vitamin D 50,000 iu's Last AEX:  03/25/13 with PG Next AEX: 03/30/15 with PG  Last Vitamin d level checked at 61  Refill authorized: Please advise.

## 2014-10-10 NOTE — Telephone Encounter (Signed)
Have pt. To take OTC Vit D 1000 IU daily this summer and will see if Vit d level is good at AEX.

## 2014-10-13 NOTE — Telephone Encounter (Signed)
Called and s/w patient she is aware to take vit d 1000 iu's daily and that she will have level checked when she comes in for AEX.   RX denied through our system.

## 2015-02-16 ENCOUNTER — Telehealth: Payer: Self-pay | Admitting: Nurse Practitioner

## 2015-02-16 ENCOUNTER — Other Ambulatory Visit: Payer: Self-pay

## 2015-02-16 DIAGNOSIS — Z1231 Encounter for screening mammogram for malignant neoplasm of breast: Secondary | ICD-10-CM

## 2015-02-16 DIAGNOSIS — E2839 Other primary ovarian failure: Secondary | ICD-10-CM

## 2015-02-16 DIAGNOSIS — Z780 Asymptomatic menopausal state: Secondary | ICD-10-CM

## 2015-02-16 NOTE — Telephone Encounter (Signed)
Patient would like an order for her bone density scan faxed to the breast center.

## 2015-02-16 NOTE — Telephone Encounter (Signed)
Spoke with patient. Advised order for BMD has been sent to the [facility]. Patient will call to schedule appointment at this time.  Routing to provider for final review. Patient agreeable to disposition. Will close encounter.   Patient aware provider will review message and nurse will return call if any additional advice or change of disposition.

## 2015-03-30 ENCOUNTER — Ambulatory Visit: Payer: BC Managed Care – PPO | Admitting: Nurse Practitioner

## 2015-04-02 ENCOUNTER — Telehealth: Payer: Self-pay | Admitting: *Deleted

## 2015-04-02 NOTE — Telephone Encounter (Signed)
-----   Message from Kem Boroughs, [place] sent at 03/31/2015 12:39 PM EDT ----- Please call pt and remind her about IFOB - I will extend out another month. ----- Message -----    From: SYSTEM    Sent: 03/31/2015  12:04 AM      To: Kem Boroughs, FNP

## 2015-04-02 NOTE — Telephone Encounter (Signed)
I spoke with the patient and she states the rectal bleeding she was having stopped.  She is having a colonoscopy this fall at [facility].  She does not have this scheduled at this time.  Patient has annual exam scheduled in our office 04/22/15.  She will discuss more at that time.  Pt declines testing at this time. Please advise order status.

## 2015-04-03 NOTE — Telephone Encounter (Signed)
Then she does not need IFOB if colonoscopy is to be done this fall. We can cancel the IFOB order.

## 2015-04-03 NOTE — Telephone Encounter (Signed)
IFOB order canceled. Closing encounter.

## 2015-04-15 ENCOUNTER — Ambulatory Visit: Payer: BC Managed Care – PPO

## 2015-04-15 ENCOUNTER — Inpatient Hospital Stay: Admission: RE | Admit: 2015-04-15 | Payer: BC Managed Care – PPO | Source: Ambulatory Visit

## 2015-04-20 ENCOUNTER — Ambulatory Visit: Payer: BC Managed Care – PPO

## 2015-04-20 ENCOUNTER — Other Ambulatory Visit: Payer: BC Managed Care – PPO

## 2015-04-22 ENCOUNTER — Ambulatory Visit (INDEPENDENT_AMBULATORY_CARE_PROVIDER_SITE_OTHER): Payer: BLUE CROSS/BLUE SHIELD | Admitting: Nurse Practitioner

## 2015-04-22 ENCOUNTER — Encounter: Payer: Self-pay | Admitting: Nurse Practitioner

## 2015-04-22 VITALS — BP 138/82 | HR 64 | Resp 16 | Ht 64.0 in | Wt 140.0 lb

## 2015-04-22 DIAGNOSIS — Z0000 Encounter for general adult medical examination without abnormal findings: Secondary | ICD-10-CM | POA: Diagnosis not present

## 2015-04-22 DIAGNOSIS — E2839 Other primary ovarian failure: Secondary | ICD-10-CM | POA: Diagnosis not present

## 2015-04-22 DIAGNOSIS — Z01419 Encounter for gynecological examination (general) (routine) without abnormal findings: Secondary | ICD-10-CM | POA: Diagnosis not present

## 2015-04-22 LAB — POCT URINALYSIS DIPSTICK
Bilirubin, UA: NEGATIVE
Blood, UA: NEGATIVE
Glucose, UA: NEGATIVE
Ketones, UA: NEGATIVE
Leukocytes, UA: NEGATIVE
Nitrite, UA: NEGATIVE
Protein, UA: NEGATIVE
Urobilinogen, UA: NEGATIVE
pH, UA: 7

## 2015-04-22 LAB — HEMOGLOBIN, FINGERSTICK: Hemoglobin, fingerstick: 12.2 g/dL (ref 12.0–16.0)

## 2015-04-22 NOTE — Progress Notes (Signed)
Patient ID: Cynthia Alexander, female   DOB: 04-17-1956, 59 y.o.   MRN: [id number] 59 y.o. [id number] Married  Caucasian Fe here for annual exam.  Husband had knee replacement.   Patient's last menstrual period was 11/08/2001 (approximate).          Sexually active: Yes.    The current method of family planning is status post hysterectomy.    Exercising: Yes.    Home exercise routine includes walking and yoga once weekly; has been moving recently so no regular exercise. Smoker:  no  Health Maintenance: Pap: 07/24/2001, WNL  MMG: 04/18/13, BI-Rads 1: negative scheduled for end of November Colonoscopy & EDG: 09/2005 Celiac, scheduled for 05/06/15  BMD: 04/18/13: T Score: Spine -2.4 / left femur neck -0.7  TDaP: 2006  Labs: HB:  12.2  Urine:  Negative    reports that she has never smoked. She has never used smokeless tobacco. She reports that she does not drink alcohol or use illicit drugs.  Past Medical History  Diagnosis Date  . Celiac sprue 3/07  . BRCA negative 01/2010    BRCA I/ II negative, done secondary to mother's history of Breast Cancer at age 3  . Osteopenia   . Thyroid disease     hypothyroid    Past Surgical History  Procedure Laterality Date  . Femoral hernia repair Right 07/2004  . Cesarean section  1987  . Vaginal delivery      x4  . Colonoscopy  10/04/05    celiac  . Esophagogastroduodenoscopy endoscopy  10/04/05    Biopsy shows celiac  . Vaginal hysterectomy  11/27/2001    secondary to adenomyosis, ovaries remain    Current Outpatient Prescriptions  Medication Sig Dispense Refill  . ALPRAZolam (XANAX) 0.25 MG tablet Take 0.25 mg by mouth at bedtime as needed for sleep.    Marland Kitchen buPROPion (WELLBUTRIN SR) 150 MG 12 hr tablet Take 2 tablets by mouth daily.  0  . Calcium-Magnesium-Vitamin D (CALCIUM 500 PO) Take 1 tablet by mouth daily.    . Cholecalciferol (VITAMIN D3) 2000 UNITS TABS Take 1 tablet by mouth daily.    Marland Kitchen diltiazem (CARDIZEM CD) 360 MG 24 hr capsule      . docusate sodium (COLACE) 100 MG capsule Take 100 mg by mouth 2 (two) times daily.    . Glucosamine-Chondroit-Vit C-Mn (GLUCOSAMINE CHONDR 1500 COMPLX) CAPS Take 2 capsules by mouth daily.    Marland Kitchen glucosamine-chondroitin 500-400 MG tablet Take 1 tablet by mouth 3 (three) times daily.    Marland Kitchen levothyroxine (SYNTHROID, LEVOTHROID) 50 MCG tablet     . Multiple Vitamin (MULTIVITAMIN) tablet Take 1 tablet by mouth daily.    . multivitamin-lutein (OCUVITE-LUTEIN) CAPS Take 2 capsules by mouth daily.     . Omega-3 Fatty Acids (FISH OIL) 1000 MG CAPS Take 4 capsules by mouth daily.    Marland Kitchen PARoxetine (PAXIL) 10 MG tablet Take 10 mg by mouth every morning.    . traZODone (DESYREL) 150 MG tablet Take 0.5 tablets by mouth daily.  0  . triamterene-hydrochlorothiazide (MAXZIDE-25) 37.5-25 MG tablet Take 1 tablet by mouth daily.  0   No current facility-administered medications for this visit.    Family History  Problem Relation Age of Onset  . Breast cancer Mother 47    second time postmenopausal  . Breast cancer Maternal Aunt     postmenopausal  . Prostate cancer Father   . Hypertension Brother     ROS:  Pertinent items are noted in  HPI.  Otherwise, a comprehensive ROS was negative.  Exam:   BP 152/90 mmHg  Pulse 64  Resp 16  Ht 5' 4" (1.626 m)  Wt 140 lb (63.504 kg)  BMI 24.02 kg/m2  LMP 11/08/2001 (Approximate) Height: 5' 4" (162.6 cm) Ht Readings from Last 3 Encounters:  04/22/15 5' 4" (1.626 m)  03/25/14 5' 4" (1.626 m)  03/19/13 5' 4" (1.626 m)    General appearance: alert, cooperative and appears stated age Head: Normocephalic, without obvious abnormality, atraumatic Neck: no adenopathy, supple, symmetrical, trachea midline and thyroid normal to inspection and palpation Lungs: clear to auscultation bilaterally Breasts: normal appearance, no masses or tenderness Heart: regular rate and rhythm Abdomen: soft, non-tender; no masses,  no organomegaly Extremities: extremities normal,  atraumatic, no cyanosis or edema Skin: Skin color, texture, turgor normal. No rashes or lesions Lymph nodes: Cervical, supraclavicular, and axillary nodes normal. No abnormal inguinal nodes palpated Neurologic: Grossly normal   Pelvic: External genitalia:  no lesions              Urethra:  normal appearing urethra with no masses, tenderness or lesions              Bartholin's and Skene's: normal                 Vagina: normal appearing vagina with normal color and discharge, no lesions,  There appears to be a left adnexal mass about 1-2 cm that is mobile and thought to be stool in the colon.  After rectal exam and no stool another bimanual was done and still feels this is stool.              Cervix: absent              Pap taken: No. Bimanual Exam:  Uterus:  uterus absent              Adnexa: no mass, fullness, tenderness               Rectovaginal: Confirms, no stool in the colon               Anus:  normal sphincter tone, no lesions  Chaperone present: no  A:  Well Woman with normal exam  S/P TVH 2003 secondary to endometriosis  History of Vit D deficiency  [facility] of breast cancer with mother at age 6 - pt is BRCA negative 01/2010  Osteopenia - stable Situational anxiety  History of Celiac sprue  ? Pelvic mass vs. Stool in colon similar episode in 2013 see addendum paper chart review   P:   Reviewed health and wellness pertinent to exam  Pap smear as above  Mammogram is due now and scheduled along with BMD  She will return for pelvic recheck with Dr. Quincy Simmonds to make sure no stool in the colon.  We are arranging this time to correlate the day after Colonoscopy so she will have had a colon prep the day before.  Counseled on breast self exam, mammography screening, adequate intake of calcium and vitamin D, diet and exercise, Kegel's exercises return annually or prn  An After Visit Summary was printed and given to the  patient.   Review of paper chart:  On AEX 12/20/2011 she had what seemed to be a mass left adnexa.  She was then seen by Dr. Sabra Heck with a PUS on 01/09/2012 that "revealed no mass on the ovaries, S/P TVH,  but had + extensive bowel activity noted and  multiple areas of stool shadowing".

## 2015-04-22 NOTE — Patient Instructions (Signed)

## 2015-04-23 NOTE — Progress Notes (Signed)
Encounter reviewed by Dr. Garry Nicolini Amundson C. Silva.  

## 2015-05-06 ENCOUNTER — Telehealth: Payer: Self-pay | Admitting: Obstetrics and Gynecology

## 2015-05-06 NOTE — Telephone Encounter (Signed)
Patient called and rescheduled her 2 week recheck from 05/07/15 to 06/11/15 with Dr. Quincy Simmonds due to her colonoscopy being rescheduled to 06/10/15. FYI only.

## 2015-05-06 NOTE — Telephone Encounter (Signed)
Thank you.  I have closed the encounter. 

## 2015-05-07 ENCOUNTER — Ambulatory Visit: Payer: BLUE CROSS/BLUE SHIELD | Admitting: Obstetrics and Gynecology

## 2015-06-08 ENCOUNTER — Ambulatory Visit
Admission: RE | Admit: 2015-06-08 | Discharge: 2015-06-08 | Disposition: A | Discharge status: Home / Self Care | Payer: BLUE CROSS/BLUE SHIELD | Source: Ambulatory Visit

## 2015-06-08 ENCOUNTER — Ambulatory Visit
Admission: RE | Admit: 2015-06-08 | Discharge: 2015-06-08 | Disposition: A | Discharge status: Home / Self Care | Payer: BLUE CROSS/BLUE SHIELD | Source: Ambulatory Visit | Attending: Nurse Practitioner | Admitting: Nurse Practitioner

## 2015-06-08 DIAGNOSIS — Z1231 Encounter for screening mammogram for malignant neoplasm of breast: Secondary | ICD-10-CM

## 2015-06-08 DIAGNOSIS — Z780 Asymptomatic menopausal state: Secondary | ICD-10-CM

## 2015-06-08 DIAGNOSIS — E2839 Other primary ovarian failure: Secondary | ICD-10-CM

## 2015-06-11 ENCOUNTER — Ambulatory Visit (INDEPENDENT_AMBULATORY_CARE_PROVIDER_SITE_OTHER): Payer: BLUE CROSS/BLUE SHIELD | Admitting: Obstetrics and Gynecology

## 2015-06-11 ENCOUNTER — Encounter: Payer: Self-pay | Admitting: Obstetrics and Gynecology

## 2015-06-11 VITALS — BP 128/90 | HR 70 | Temp 98.0°F | Resp 14 | Ht 64.0 in | Wt 137.0 lb

## 2015-06-11 DIAGNOSIS — R938 Abnormal findings on diagnostic imaging of other specified body structures: Secondary | ICD-10-CM

## 2015-06-11 DIAGNOSIS — M8580 Other specified disorders of bone density and structure, unspecified site: Secondary | ICD-10-CM

## 2015-06-11 DIAGNOSIS — Z01411 Encounter for gynecological examination (general) (routine) with abnormal findings: Secondary | ICD-10-CM

## 2015-06-11 NOTE — Progress Notes (Signed)
GYNECOLOGY  VISIT   HPI: 59 y.o.   Married  Caucasian  female   Cynthia Alexander with Patient's last menstrual period was 11/08/2001 (approximate).   here for  Follow up - pelvic recheck   Pelvic exam on 04/22/15 with Edman Circle, "There appears to be a left adnexal mass about 1-2 cm that is mobile and thought to be stool in the colon. After rectal exam and no stool another bimanual was done and still feels this is stool."  Denies usual bloating.  No pain.  Status post [facility] for adenomyosis.  Ovaries remain.   Patient has done BRCA testing due to [place] of breast cancer. - status is negative.   Had colonoscopy yesterday.  Did not have complete visualization so she will need to do additional testing.  Sees [name].   Asking about her bone density report from this week at [facility].  Osteopenia of the spine -  T score - 1.9. - improved. Normal left hip - T score - 0.6. FRAX model - The probability of a major osteoporitic fracture is 6.6 % within the next ten years. The probability of a hip fracture is 0.3 % within the next ten years.  GYNECOLOGIC HISTORY: Patient's last menstrual period was 11/08/2001 (approximate). Contraception: Hysterectomy  Menopausal hormone therapy: None Last mammogram: 06/10/15 BIRADS1:neg Last pap smear: 2003 normal         OB History    Gravida Para Term Preterm AB TAB SAB Ectopic Multiple Living   _0         There are no active problems to display for this patient.   Past Medical History  Diagnosis Date  . Celiac sprue 3/07  . BRCA negative 01/2010    BRCA I/ II negative, done secondary to mother's history of Breast Cancer at age 87  . Osteopenia   . Thyroid disease     hypothyroid    Past Surgical History  Procedure Laterality Date  . Femoral hernia repair Right 07/2004  . Cesarean section  1987  . Vaginal delivery      x4  . Colonoscopy  10/04/05    celiac  . Esophagogastroduodenoscopy endoscopy  10/04/05    Biopsy shows  celiac  . Vaginal hysterectomy  11/27/2001    secondary to adenomyosis, ovaries remain    Current Outpatient Prescriptions  Medication Sig Dispense Refill  . ALPRAZolam (XANAX) 0.25 MG tablet Take 0.25 mg by mouth at bedtime as needed for sleep.    Marland Kitchen buPROPion (WELLBUTRIN SR) 150 MG 12 hr tablet Take 2 tablets by mouth daily.  0  . Calcium-Magnesium-Vitamin D (CALCIUM 500 PO) Take 1 tablet by mouth daily.    . Cholecalciferol (VITAMIN D3) 2000 UNITS TABS Take 1 tablet by mouth daily.    Marland Kitchen diltiazem (CARDIZEM CD) 360 MG 24 hr capsule     . docusate sodium (COLACE) 100 MG capsule Take 100 mg by mouth 2 (two) times daily.    . Glucosamine-Chondroit-Vit C-Mn (GLUCOSAMINE CHONDR 1500 COMPLX) CAPS Take 2 capsules by mouth daily.    Marland Kitchen levothyroxine (SYNTHROID, LEVOTHROID) 50 MCG tablet     . Multiple Vitamin (MULTIVITAMIN) tablet Take 1 tablet by mouth daily.    . multivitamin-lutein (OCUVITE-LUTEIN) CAPS Take 2 capsules by mouth daily.     . Omega-3 Fatty Acids (FISH OIL) 1000 MG CAPS Take 4 capsules by mouth daily.    Marland Kitchen PARoxetine (PAXIL) 10 MG tablet Take 10 mg by  mouth every morning.    . traZODone (DESYREL) 150 MG tablet Take 0.5 tablets by mouth daily.  0  . triamterene-hydrochlorothiazide (MAXZIDE-25) 37.5-25 MG tablet Take 1 tablet by mouth daily.  0   No current facility-administered medications for this visit.     ALLERGIES: Banana  Family History  Problem Relation Age of Onset  . Breast cancer Mother 60    second time postmenopausal  . Breast cancer Maternal Aunt     postmenopausal  . Prostate cancer Father   . Hypertension Brother     Social History   Social History  . Marital Status: Married    Spouse Name: N/A  . Number of Children: 5  . Years of Education: N/A   Occupational History  . Not on file.   Social History Main Topics  . Smoking status: Never Smoker   . Smokeless tobacco: Never Used  . Alcohol Use: No  . Drug Use: No  . Sexual Activity:     Partners: Male    Birth Control/ Protection: Surgical     Comment: hysterectomy   Other Topics Concern  . Not on file   Social History Narrative    ROS:  Pertinent items are noted in HPI.  PHYSICAL EXAMINATION:    BP 128/90 mmHg  Pulse 70  Temp(Src) 98 F (36.7 C) (Oral)  Resp 14  Ht _0  (1.626 m)  Wt 137 lb (62.143 kg)  BMI 23.50 kg/m2  LMP 11/08/2001 (Approximate)    General appearance: alert, cooperative and appears stated age    Pelvic: External genitalia:  no lesions              Urethra:  normal appearing urethra with no masses, tenderness or lesions              Bartholins and Skenes: normal                 Vagina: normal appearing vagina with normal color and discharge, no lesions.  Some mild atrophy noted.              Cervix: absent            Bimanual Exam:  Uterus:  uterus absent              Adnexa: normal adnexa and no mass, fullness, tenderness              Rectovaginal: Yes.  .  Confirms.              Anus:  normal sphincter tone, no lesions  Chaperone was present for exam.  ASSESSMENT  Abnormal pelvic exam.  Was stool in the colon.  Normal pelvic exam today.  Osteopenia of spine - improved.    PLAN  Counseled regarding normal pelvic exam.  No further work up needed.  No ultrasound needed.  Discussed osteopenia. Recommendation for weight bearing exercise, Ca, Vit D, and repeat bone density in 2 years.    An After Visit Summary was printed and given to the patient.  __15____ minutes face to face time of which over 50% was spent in counseling.

## 2016-04-25 ENCOUNTER — Encounter: Payer: Self-pay | Admitting: Nurse Practitioner

## 2016-04-25 ENCOUNTER — Ambulatory Visit (INDEPENDENT_AMBULATORY_CARE_PROVIDER_SITE_OTHER): Payer: BLUE CROSS/BLUE SHIELD | Admitting: Nurse Practitioner

## 2016-04-25 VITALS — BP 118/76 | HR 72 | Ht 64.0 in | Wt 138.0 lb

## 2016-04-25 DIAGNOSIS — Z1211 Encounter for screening for malignant neoplasm of colon: Secondary | ICD-10-CM | POA: Diagnosis not present

## 2016-04-25 DIAGNOSIS — Z01419 Encounter for gynecological examination (general) (routine) without abnormal findings: Secondary | ICD-10-CM | POA: Diagnosis not present

## 2016-04-25 DIAGNOSIS — E559 Vitamin D deficiency, unspecified: Secondary | ICD-10-CM | POA: Diagnosis not present

## 2016-04-25 DIAGNOSIS — Z0000 Encounter for general adult medical examination without abnormal findings: Secondary | ICD-10-CM

## 2016-04-25 NOTE — Patient Instructions (Signed)

## 2016-04-25 NOTE — Progress Notes (Signed)
Patient ID: Cynthia Alexander, female   DOB: 04/21/1956, 60 y.o.   MRN: [id number]  60 y.o. [id number] Married Caucasian Fe here for annual exam.  Recent labs at PCP last week.  No new health problems.  Feels well.  Husband did well with knee replacement but needed a lot of help. Now has 16 grandchildren.  They had a family reunion at the beach this year - total of 97 family members.  Patient's last menstrual period was 11/08/2001 (approximate).          Sexually active: Yes.    The current method of family planning is status post hysterectomy.    Exercising: Yes.    Home exercise routine includes yoga and walking. Smoker:  no  Health Maintenance: Pap: 07/24/2001, Negative (hysterectomy) MMG: 06/08/15, Bi-Rads 1:  Negative Colonoscopy & EDG: 09/2005 Celiac, scheduled 05/06/15 and had a lot of stool in colon BMD: 06/08/15 T Score: -1.9 Spine / -0.6 Left Femur Neck  TDaP: 2006 thinks got done at PCP Shingles: 04/20/16  Pneumonia: Not indicated due to age Hep C: done today (declines HIV) Labs: HB: 12.9   Urine: Negative   reports that she has never smoked. She has never used smokeless tobacco. She reports that she does not drink alcohol or use drugs.  Past Medical History:  Diagnosis Date  . BRCA negative 01/2010   BRCA I/ II negative, done secondary to mother's history of Breast Cancer at age 45  . Celiac sprue 3/07  . Osteopenia   . Thyroid disease    hypothyroid    Past Surgical History:  Procedure Laterality Date  . CESAREAN SECTION  1987  . COLONOSCOPY  10/04/05   celiac  . ESOPHAGOGASTRODUODENOSCOPY ENDOSCOPY  10/04/05   Biopsy shows celiac  . FEMORAL HERNIA REPAIR Right 07/2004  . VAGINAL DELIVERY     x4  . VAGINAL HYSTERECTOMY  11/27/2001   secondary to adenomyosis, ovaries remain    Current Outpatient Prescriptions  Medication Sig Dispense Refill  . ALPRAZolam (XANAX) 0.25 MG tablet Take 0.25 mg by mouth at bedtime as needed for sleep.    . buPROPion (WELLBUTRIN SR) 150 MG  12 hr tablet Take 2 tablets by mouth daily.  0  . Calcium-Magnesium-Vitamin D (CALCIUM 500 PO) Take 1 tablet by mouth daily.    . Cholecalciferol (VITAMIN D3) 2000 UNITS TABS Take 1 tablet by mouth daily.    . diltiazem (CARDIZEM CD) 360 MG 24 hr capsule     . docusate sodium (COLACE) 100 MG capsule Take 100 mg by mouth 2 (two) times daily.    . Glucosamine-Chondroit-Vit C-Mn (GLUCOSAMINE CHONDR 1500 COMPLX) CAPS Take 2 capsules by mouth daily.    . levothyroxine (SYNTHROID, LEVOTHROID) 50 MCG tablet     . Multiple Vitamin (MULTIVITAMIN) tablet Take 1 tablet by mouth daily.    . multivitamin-lutein (OCUVITE-LUTEIN) CAPS Take 2 capsules by mouth daily.     . Omega-3 Fatty Acids (FISH OIL) 1000 MG CAPS Take 4 capsules by mouth daily.    . PARoxetine (PAXIL) 10 MG tablet Take 10 mg by mouth every morning.    . traZODone (DESYREL) 150 MG tablet Take 0.5 tablets by mouth daily.  0  . triamterene-hydrochlorothiazide (MAXZIDE-25) 37.5-25 MG tablet Take 1 tablet by mouth daily.  0   No current facility-administered medications for this visit.     Family History  Problem Relation Age of Onset  . Breast cancer Mother 45    second time postmenopausal  . Breast   cancer Maternal Aunt     postmenopausal  . Prostate cancer Father   . Hypertension Brother     ROS:  Pertinent items are noted in HPI.  Otherwise, a comprehensive ROS was negative.  Exam:   LMP 11/08/2001 (Approximate)    Ht Readings from Last 3 Encounters:  06/11/15 5' 4" (1.626 m)  04/22/15 5' 4" (1.626 m)  03/25/14 5' 4" (1.626 m)    General appearance: alert, cooperative and appears stated age Head: Normocephalic, without obvious abnormality, atraumatic Neck: no adenopathy, supple, symmetrical, trachea midline and thyroid normal to inspection and palpation Lungs: clear to auscultation bilaterally Breasts: normal appearance, no masses or tenderness Heart: regular rate and rhythm Abdomen: soft, non-tender; no masses,  no  organomegaly Extremities: extremities normal, atraumatic, no cyanosis or edema Skin: Skin color, texture, turgor normal. No rashes or lesions Lymph nodes: Cervical, supraclavicular, and axillary nodes normal. No abnormal inguinal nodes palpated Neurologic: Grossly normal   Pelvic: External genitalia:  no lesions              Urethra:  normal appearing urethra with no masses, tenderness or lesions              Bartholin's and Skene's: normal                 Vagina: normal appearing vagina with normal color and discharge, no lesions              Cervix: absent              Pap taken: No. Bimanual Exam:  Uterus:  uterus absent              Adnexa: no mass, fullness, tenderness               Rectovaginal: Confirms               Anus:  normal sphincter tone, no lesions  Chaperone present: yes  A:  Well Woman with normal exam     S/P TVH 2003 secondary to endometriosis  History of Vit D deficiency  [facility] of breast cancer with mother at age 45 - pt is BRCA negative 01/2010  Osteopenia - stable Situational anxiety  History of Celiac sprue   P:   Reviewed health and wellness pertinent to exam  Pap smear as above  Mammogram is due 11/17  IFOB is given per request  Will follow with labs  Counseled on breast self exam, mammography screening, adequate intake of calcium and vitamin D, diet and exercise, Kegel's exercises return annually or prn  An After Visit Summary was printed and given to the patient.   

## 2016-04-26 LAB — HEPATITIS C ANTIBODY: HCV Ab: NEGATIVE

## 2016-04-26 LAB — VITAMIN D 25 HYDROXY (VIT D DEFICIENCY, FRACTURES): Vit D, 25-Hydroxy: 60 ng/mL (ref 30–100)

## 2016-05-01 NOTE — Progress Notes (Signed)
Encounter reviewed by Dr. Brook Amundson C. Silva.  

## 2016-05-02 NOTE — Addendum Note (Signed)
Addended by: Kem Boroughs R on: 05/02/2016 08:01 AM   Modules accepted: Orders

## 2016-06-07 ENCOUNTER — Other Ambulatory Visit: Payer: Self-pay | Admitting: Family Medicine

## 2016-06-07 DIAGNOSIS — Z1231 Encounter for screening mammogram for malignant neoplasm of breast: Secondary | ICD-10-CM

## 2016-06-16 ENCOUNTER — Encounter: Payer: Self-pay | Admitting: *Deleted

## 2016-06-28 LAB — FECAL OCCULT BLOOD, IMMUNOCHEMICAL: IFOBT: NEGATIVE

## 2016-06-28 NOTE — Addendum Note (Signed)
Addended by: Terence Lux A on: 06/28/2016 08:36 AM   Modules accepted: Orders

## 2016-06-30 ENCOUNTER — Ambulatory Visit: Payer: BLUE CROSS/BLUE SHIELD

## 2016-07-21 ENCOUNTER — Ambulatory Visit: Payer: BLUE CROSS/BLUE SHIELD

## 2016-08-02 ENCOUNTER — Ambulatory Visit
Admission: RE | Admit: 2016-08-02 | Discharge: 2016-08-02 | Disposition: A | Discharge status: Home / Self Care | Payer: BLUE CROSS/BLUE SHIELD | Source: Ambulatory Visit | Attending: Family Medicine | Admitting: Family Medicine

## 2016-08-02 DIAGNOSIS — Z1231 Encounter for screening mammogram for malignant neoplasm of breast: Secondary | ICD-10-CM

## 2017-01-30 ENCOUNTER — Telehealth: Payer: Self-pay | Admitting: Obstetrics and Gynecology

## 2017-01-30 NOTE — Telephone Encounter (Signed)
Left patient a message to call back to reschedule a future appointment that was cancelled by the provider for aex.

## 2017-05-01 ENCOUNTER — Ambulatory Visit: Payer: BLUE CROSS/BLUE SHIELD | Admitting: Nurse Practitioner

## 2017-05-04 ENCOUNTER — Ambulatory Visit (INDEPENDENT_AMBULATORY_CARE_PROVIDER_SITE_OTHER): Payer: BLUE CROSS/BLUE SHIELD | Admitting: Obstetrics and Gynecology

## 2017-05-04 ENCOUNTER — Encounter: Payer: Self-pay | Admitting: Obstetrics and Gynecology

## 2017-05-04 VITALS — BP 118/60 | HR 80 | Resp 16 | Ht 63.75 in | Wt 142.0 lb

## 2017-05-04 DIAGNOSIS — E2839 Other primary ovarian failure: Secondary | ICD-10-CM | POA: Diagnosis not present

## 2017-05-04 DIAGNOSIS — Z01419 Encounter for gynecological examination (general) (routine) without abnormal findings: Secondary | ICD-10-CM

## 2017-05-04 DIAGNOSIS — M8580 Other specified disorders of bone density and structure, unspecified site: Secondary | ICD-10-CM

## 2017-05-04 NOTE — Progress Notes (Signed)
61 y.o. [id number] MarriedCaucasianF here for annual exam.  H/O hysterectomy. Sexually active, no pain.  Family history of breast cancer in her Mother in her 65's. The patient has had negative BRCA testing.     Patient's last menstrual period was 11/08/2001 (approximate).          Sexually active: Yes.    The current method of family planning is status post hysterectomy.    Exercising: Yes.    yoga/walking Smoker:  no  Health Maintenance: Pap:  unsure History of abnormal Pap:  no MMG:  08-02-16 WNL  Colonoscopy:  2017, attempted colonoscopy, doesn't clean out. Using miralax. She is going to wait to do it again until she has medicare.  BMD:   06-08-15 osteopenia  TDaP:  04-11-15  Gardasil: N/A   reports that she has never smoked. She has never used smokeless tobacco. She reports that she does not drink alcohol or use drugs.She is a homemaker, substitute teaches. 5 kids, 15 grand children, one on the way. 2 kids are local, rest are further away.   Past Medical History:  Diagnosis Date  . BRCA negative 01/2010   BRCA I/ II negative, done secondary to mother's history of Breast Cancer at age 11  . Celiac sprue 3/07  . History of depression   . Osteopenia   . Thyroid disease    hypothyroid    Past Surgical History:  Procedure Laterality Date  . CESAREAN SECTION  1987  . COLONOSCOPY  10/04/05   celiac  . ESOPHAGOGASTRODUODENOSCOPY ENDOSCOPY  10/04/05   Biopsy shows celiac  . FEMORAL HERNIA REPAIR Right 07/2004  . VAGINAL DELIVERY     x4  . VAGINAL HYSTERECTOMY  11/27/2001   secondary to adenomyosis, ovaries remain    Current Outpatient Prescriptions  Medication Sig Dispense Refill  . ALPRAZolam (XANAX) 0.25 MG tablet Take 0.25 mg by mouth at bedtime as needed for sleep.    Marland Kitchen buPROPion (WELLBUTRIN SR) 150 MG 12 hr tablet Take 2 tablets by mouth daily.  0  . Calcium-Magnesium-Vitamin D (CALCIUM 500 PO) Take 1 tablet by mouth daily.    . Cholecalciferol (VITAMIN D3) 2000 UNITS TABS  Take 1 tablet by mouth daily.    Marland Kitchen diltiazem (CARDIZEM CD) 360 MG 24 hr capsule     . docusate sodium (COLACE) 100 MG capsule Take 100 mg by mouth 2 (two) times daily.    . Glucosamine-Chondroit-Vit C-Mn (GLUCOSAMINE CHONDR 1500 COMPLX) CAPS Take 2 capsules by mouth daily.    Marland Kitchen levothyroxine (SYNTHROID, LEVOTHROID) 50 MCG tablet     . Multiple Vitamin (MULTIVITAMIN) tablet Take 1 tablet by mouth daily.    . Omega-3 Fatty Acids (FISH OIL) 1000 MG CAPS Take 4 capsules by mouth daily.    Marland Kitchen PARoxetine (PAXIL) 10 MG tablet Take 10 mg by mouth every morning.    . polyethylene glycol (MIRALAX / GLYCOLAX) packet Take 17 g by mouth daily.    . traZODone (DESYREL) 150 MG tablet Take 0.5 tablets by mouth daily.  0  . triamterene-hydrochlorothiazide (MAXZIDE-25) 37.5-25 MG tablet Take 1 tablet by mouth daily.  0   No current facility-administered medications for this visit.     Family History  Problem Relation Age of Onset  . Breast cancer Mother 63       second time postmenopausal  . Prostate cancer Father   . Chronic Renal Failure Father   . Hypertension Brother   . Breast cancer Maternal Aunt  postmenopausal    Review of Systems  Constitutional: Negative.   HENT: Negative.   Eyes: Negative.   Respiratory: Negative.   Cardiovascular: Negative.   Gastrointestinal: Positive for constipation.  Endocrine: Negative.   Genitourinary: Negative.   Musculoskeletal: Negative.   Skin: Negative.   Allergic/Immunologic: Negative.   Neurological: Negative.   Psychiatric/Behavioral: Negative.   BM small amount several times a day, larger BM once a month.   Exam:   BP 118/60 (BP Location: Right Arm, Patient Position: Sitting, Cuff Size: Normal)   Pulse 80   Resp 16   Ht 5' 3.75" (1.619 m)   Wt 142 lb (64.4 kg)   LMP 11/08/2001 (Approximate)   BMI 24.57 kg/m   Weight change: '@WEIGHTCHANGE' @ Height:   Height: 5' 3.75" (161.9 cm)  Ht Readings from Last 3 Encounters:  05/04/17 5' 3.75"  (1.619 m)  04/25/16 '5\' 4"'  (1.626 m)  06/11/15 '5\' 4"'  (1.626 m)    General appearance: alert, cooperative and appears stated age Head: Normocephalic, without obvious abnormality, atraumatic Neck: no adenopathy, supple, symmetrical, trachea midline and thyroid normal to inspection and palpation Lungs: clear to auscultation bilaterally Cardiovascular: regular rate and rhythm Breasts: normal appearance, no masses or tenderness Abdomen: soft, non-tender; non distended,  no masses,  no organomegaly Extremities: extremities normal, atraumatic, no cyanosis or edema Skin: Skin color, texture, turgor normal. No rashes or lesions Lymph nodes: Cervical, supraclavicular, and axillary nodes normal. No abnormal inguinal nodes palpated Neurologic: Grossly normal   Pelvic: External genitalia:  no lesions              Urethra:  normal appearing urethra with no masses, tenderness or lesions              Bartholins and Skenes: normal                 Vagina: normal appearing vagina with normal color and discharge, no lesions              Cervix: absent               Bimanual Exam:  Uterus:  uterus absent              Adnexa: no mass, fullness, tenderness               Rectovaginal: Confirms               Anus:  normal sphincter tone, no lesions  Chaperone was present for exam.  A:  Well Woman with normal exam  Osteopenia   P:   No pap needed  Mammogram and DEXA in January  Labs with her primary  Colonoscopy was not satisfactory last year  IFOB cards given  Discussed breast self exam  Discussed calcium and vit D intake

## 2017-05-04 NOTE — Patient Instructions (Signed)

## 2017-07-05 ENCOUNTER — Other Ambulatory Visit: Payer: Self-pay | Admitting: Obstetrics and Gynecology

## 2017-07-05 DIAGNOSIS — Z1231 Encounter for screening mammogram for malignant neoplasm of breast: Secondary | ICD-10-CM

## 2017-08-07 ENCOUNTER — Ambulatory Visit
Admission: RE | Admit: 2017-08-07 | Discharge: 2017-08-07 | Disposition: A | Discharge status: Home / Self Care | Payer: BLUE CROSS/BLUE SHIELD | Source: Ambulatory Visit | Attending: Obstetrics and Gynecology | Admitting: Obstetrics and Gynecology

## 2017-08-07 DIAGNOSIS — E2839 Other primary ovarian failure: Secondary | ICD-10-CM

## 2017-08-07 DIAGNOSIS — M8580 Other specified disorders of bone density and structure, unspecified site: Secondary | ICD-10-CM

## 2017-08-07 DIAGNOSIS — Z1231 Encounter for screening mammogram for malignant neoplasm of breast: Secondary | ICD-10-CM

## 2017-09-18 ENCOUNTER — Encounter: Payer: Self-pay | Admitting: Genetics

## 2018-05-14 ENCOUNTER — Ambulatory Visit: Payer: BLUE CROSS/BLUE SHIELD | Admitting: Obstetrics and Gynecology

## 2019-05-14 ENCOUNTER — Other Ambulatory Visit: Payer: Self-pay

## 2019-05-14 NOTE — Progress Notes (Addendum)
63 y.o. [id number] Married White or Caucasian Not Hispanic or Latino female here for annual exam.  Cynthia Alexander would like to review her bone density. She states that she did not get the result. Mother with breast cancer in her 32's, patient with negative BRCA testing.  No vaginal bleeding, no dyspareunia.     Patient's last menstrual period was 11/08/2001 (approximate).          Sexually active: Yes.    The current method of family planning is status post hysterectomy.    Exercising: Yes.    Yoga Smoker:  no  Health Maintenance: Pap:  Unsure  History of abnormal Pap:  no MMG:08/07/17 Density B Bi-rads 1 neg  BMD:   08/07/17 osteopenic. FRAX 8.7/0.8% Colonoscopy: 2017, attempted colonoscopy, doesn't clean out. Using miralax. She is going to wait to do it again until she has medicare.  TDaP: 2016 Gardasil: na   reports that she has never smoked. She has never used smokeless tobacco. She reports that she does not drink alcohol or use drugs. 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 2 kids are local, rest are further away.   Past Medical History:  Diagnosis Date  . BRCA negative 01/2010   BRCA I/ II negative, done secondary to mother's history of Breast Cancer at age 93  . Celiac sprue 3/07  . History of depression   . Osteopenia   . Thyroid disease    hypothyroid    Past Surgical History:  Procedure Laterality Date  . CESAREAN SECTION  1987  . COLONOSCOPY  10/04/05   celiac  . ESOPHAGOGASTRODUODENOSCOPY ENDOSCOPY  10/04/05   Biopsy shows celiac  . FEMORAL HERNIA REPAIR Right 07/2004  . VAGINAL DELIVERY     x4  . VAGINAL HYSTERECTOMY  11/27/2001   secondary to adenomyosis, ovaries remain    Current Outpatient Medications  Medication Sig Dispense Refill  . ALPRAZolam (XANAX) 0.25 MG tablet Take 0.25 mg by mouth at bedtime as needed for sleep.    Marland Kitchen buPROPion (WELLBUTRIN SR) 150 MG 12 hr tablet Take 2 tablets by mouth daily.  0  . Calcium-Magnesium-Vitamin D  (CALCIUM 500 PO) Take 1 tablet by mouth daily.    . Cholecalciferol (VITAMIN D3) 2000 UNITS TABS Take 1 tablet by mouth daily.    Marland Kitchen diltiazem (CARDIZEM CD) 360 MG 24 hr capsule     . docusate sodium (COLACE) 100 MG capsule Take 100 mg by mouth 2 (two) times daily.    . Glucosamine-Chondroit-Vit C-Mn (GLUCOSAMINE CHONDR 1500 COMPLX) CAPS Take 2 capsules by mouth daily.    Marland Kitchen levothyroxine (SYNTHROID, LEVOTHROID) 50 MCG tablet     . Multiple Vitamin (MULTIVITAMIN) tablet Take 1 tablet by mouth daily.    . Omega-3 Fatty Acids (FISH OIL) 1000 MG CAPS Take 4 capsules by mouth daily.    Marland Kitchen PARoxetine (PAXIL) 10 MG tablet Take 10 mg by mouth every morning.    . polyethylene glycol (MIRALAX / GLYCOLAX) packet Take 17 g by mouth daily.    . traZODone (DESYREL) 150 MG tablet Take 0.5 tablets by mouth daily.  0  . triamterene-hydrochlorothiazide (MAXZIDE-25) 37.5-25 MG tablet Take 1 tablet by mouth daily.  0   No current facility-administered medications for this visit.     Family History  Problem Relation Age of Onset  . Breast cancer Mother 85       second time postmenopausal  . Prostate cancer Father   . Chronic Renal Failure Father   .  Hypertension Brother   . Breast cancer Maternal Aunt        postmenopausal    Review of Systems  All other systems reviewed and are negative.   Exam:   BP 130/68   Pulse (!) 53   Temp (!) 97.3 F (36.3 C)   Ht '5\' 4"'  (1.626 m)   Wt 142 lb (64.4 kg)   LMP 11/08/2001 (Approximate)   SpO2 98%   BMI 24.37 kg/m   Weight change: '@WEIGHTCHANGE' @ Height:   Height: '5\' 4"'  (162.6 cm)  Ht Readings from Last 3 Encounters:  05/15/19 '5\' 4"'  (1.626 m)  05/04/17 5' 3.75" (1.619 m)  04/25/16 '5\' 4"'  (1.626 m)    General appearance: alert, cooperative and appears stated age Head: Normocephalic, without obvious abnormality, atraumatic Neck: no adenopathy, supple, symmetrical, trachea midline and thyroid normal to inspection and palpation Lungs: clear to auscultation  bilaterally Cardiovascular: regular rate and rhythm Breasts: normal appearance, no masses or tenderness Abdomen: soft, non-tender; non distended,  no masses,  no organomegaly Extremities: extremities normal, atraumatic, no cyanosis or edema Skin: Skin color, texture, turgor normal. No rashes or lesions Lymph nodes: Cervical, supraclavicular, and axillary nodes normal. No abnormal inguinal nodes palpated Neurologic: Grossly normal   Pelvic: External genitalia:  no lesions              Urethra:  normal appearing urethra with no masses, tenderness or lesions              Bartholins and Skenes: normal                 Vagina: normal appearing vagina with normal color and discharge, no lesions              Cervix: absent               Bimanual Exam:  Uterus:  uterus absent              Adnexa: no mass, fullness, tenderness               Rectovaginal: Confirms               Anus:  normal sphincter tone, no lesions  Chaperone was present for exam.  A:  Well Woman with normal exam  HTN, hypothyroid, managed by primary (overdue for lab work)  P:   No pap needed  Mammogram due, she will schedule  IFOB, declines other testing this year  DEXA in 1/21  Discussed breast self exam  Discussed calcium and vit D intake  Screening labs, TSH (will forward to primary)

## 2019-05-15 ENCOUNTER — Ambulatory Visit: Payer: 59 | Admitting: Obstetrics and Gynecology

## 2019-05-15 ENCOUNTER — Encounter: Payer: Self-pay | Admitting: Obstetrics and Gynecology

## 2019-05-15 VITALS — BP 130/68 | HR 53 | Temp 97.3°F | Ht 64.0 in | Wt 142.0 lb

## 2019-05-15 DIAGNOSIS — Z1211 Encounter for screening for malignant neoplasm of colon: Secondary | ICD-10-CM

## 2019-05-15 DIAGNOSIS — Z0000 Encounter for general adult medical examination without abnormal findings: Secondary | ICD-10-CM | POA: Diagnosis not present

## 2019-05-15 DIAGNOSIS — E038 Other specified hypothyroidism: Secondary | ICD-10-CM

## 2019-05-15 DIAGNOSIS — Z01419 Encounter for gynecological examination (general) (routine) without abnormal findings: Secondary | ICD-10-CM | POA: Diagnosis not present

## 2019-05-15 DIAGNOSIS — M8580 Other specified disorders of bone density and structure, unspecified site: Secondary | ICD-10-CM | POA: Diagnosis not present

## 2019-05-15 NOTE — Patient Instructions (Signed)

## 2019-05-16 LAB — CBC
Hematocrit: 36 % (ref 34.0–46.6)
Hemoglobin: 12.2 g/dL (ref 11.1–15.9)
MCH: 32.2 pg (ref 26.6–33.0)
MCHC: 33.9 g/dL (ref 31.5–35.7)
MCV: 95 fL (ref 79–97)
Platelets: 263 10*3/uL (ref 150–450)
RBC: 3.79 x10E6/uL (ref 3.77–5.28)
RDW: 13.5 % (ref 11.7–15.4)
WBC: 5 10*3/uL (ref 3.4–10.8)

## 2019-05-16 LAB — COMPREHENSIVE METABOLIC PANEL
ALT: 13 IU/L (ref 0–32)
AST: 20 IU/L (ref 0–40)
Albumin/Globulin Ratio: 1.8 (ref 1.2–2.2)
Albumin: 4.4 g/dL (ref 3.8–4.8)
Alkaline Phosphatase: 64 IU/L (ref 39–117)
BUN/Creatinine Ratio: 15 (ref 12–28)
BUN: 13 mg/dL (ref 8–27)
Bilirubin Total: 0.2 mg/dL (ref 0.0–1.2)
CO2: 29 mmol/L (ref 20–29)
Calcium: 9.1 mg/dL (ref 8.7–10.3)
Chloride: 97 mmol/L (ref 96–106)
Creatinine, Ser: 0.87 mg/dL (ref 0.57–1.00)
GFR calc Af Amer: 81 mL/min/{1.73_m2} (ref >59)
GFR calc non Af Amer: 71 mL/min/{1.73_m2} (ref >59)
Globulin, Total: 2.4 g/dL (ref 1.5–4.5)
Glucose: 73 mg/dL (ref 65–99)
Potassium: 3.3 mmol/L — ABNORMAL LOW (ref 3.5–5.2)
Sodium: 138 mmol/L (ref 134–144)
Total Protein: 6.8 g/dL (ref 6.0–8.5)

## 2019-05-16 LAB — LIPID PANEL
Chol/HDL Ratio: 3.6 ratio (ref 0.0–4.4)
Cholesterol, Total: 232 mg/dL — ABNORMAL HIGH (ref 100–199)
HDL: 64 mg/dL (ref >39)
LDL Chol Calc (NIH): 156 mg/dL — ABNORMAL HIGH (ref 0–99)
Triglycerides: 69 mg/dL (ref 0–149)
VLDL Cholesterol Cal: 12 mg/dL (ref 5–40)

## 2019-05-16 LAB — TSH: TSH: 2.96 u[IU]/mL (ref 0.450–4.500)

## 2019-05-16 LAB — VITAMIN D 25 HYDROXY (VIT D DEFICIENCY, FRACTURES): Vit D, 25-Hydroxy: 43.9 ng/mL (ref 30.0–100.0)

## 2019-05-27 ENCOUNTER — Other Ambulatory Visit: Payer: Self-pay | Admitting: Obstetrics and Gynecology

## 2019-05-27 DIAGNOSIS — Z1231 Encounter for screening mammogram for malignant neoplasm of breast: Secondary | ICD-10-CM

## 2019-08-21 ENCOUNTER — Other Ambulatory Visit: Payer: BLUE CROSS/BLUE SHIELD

## 2019-08-21 ENCOUNTER — Other Ambulatory Visit: Payer: Self-pay

## 2019-08-21 ENCOUNTER — Ambulatory Visit
Admission: RE | Admit: 2019-08-21 | Discharge: 2019-08-21 | Disposition: A | Discharge status: Home / Self Care | Payer: 59 | Source: Ambulatory Visit | Attending: Obstetrics and Gynecology | Admitting: Obstetrics and Gynecology

## 2019-08-21 DIAGNOSIS — Z1231 Encounter for screening mammogram for malignant neoplasm of breast: Secondary | ICD-10-CM

## 2019-08-21 IMAGING — MG DIGITAL SCREENING BILAT W/ CAD
4 series · 4 of 4 positions shown · non-contrast
Comparison: Previous exam(s).

CLINICAL DATA: Screening.

EXAM:
DIGITAL SCREENING BILATERAL MAMMOGRAM WITH CAD

[R MLO]
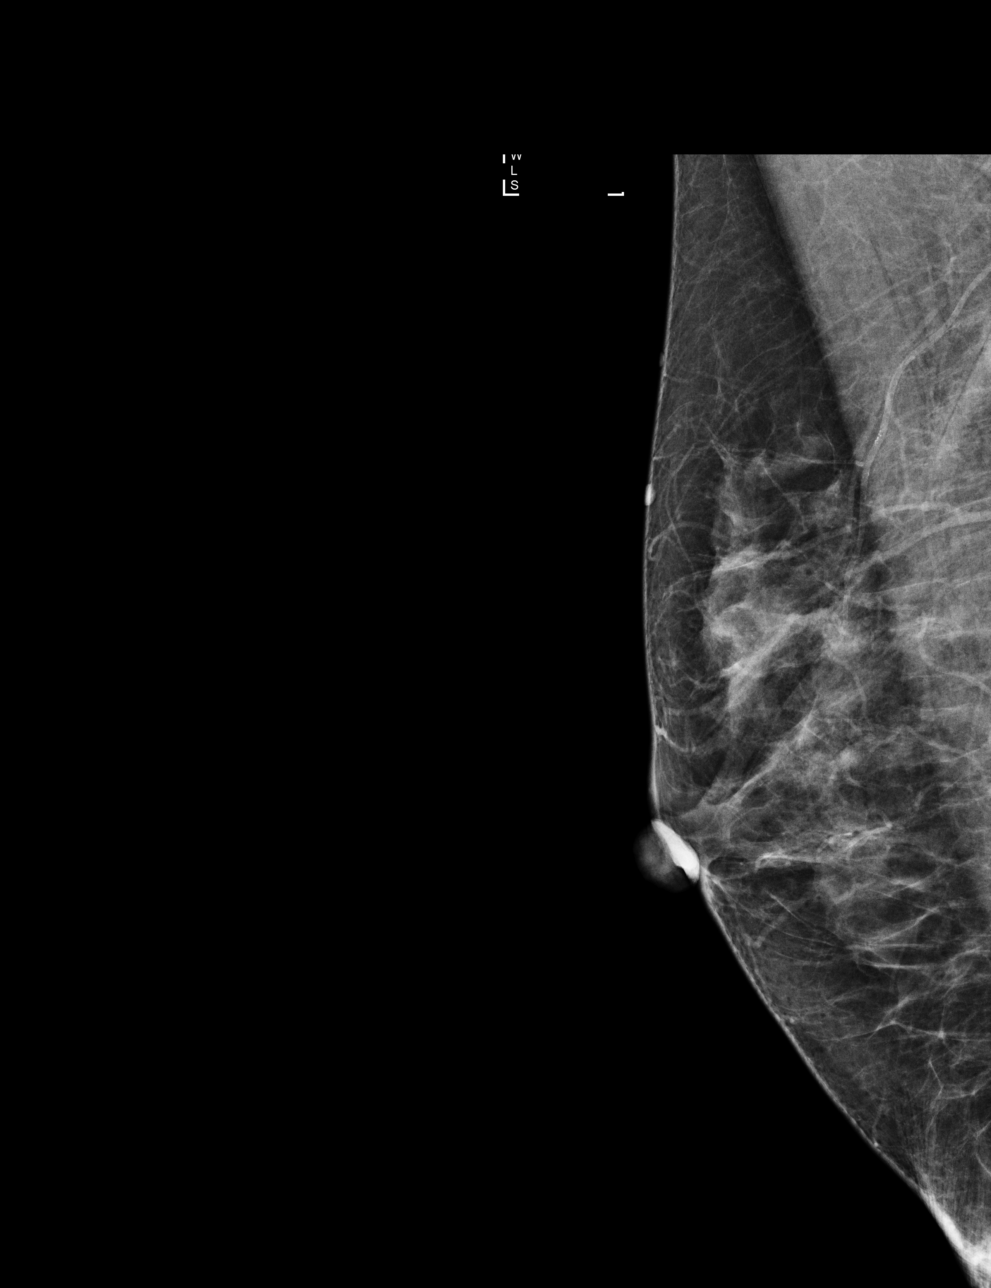

[L CC]
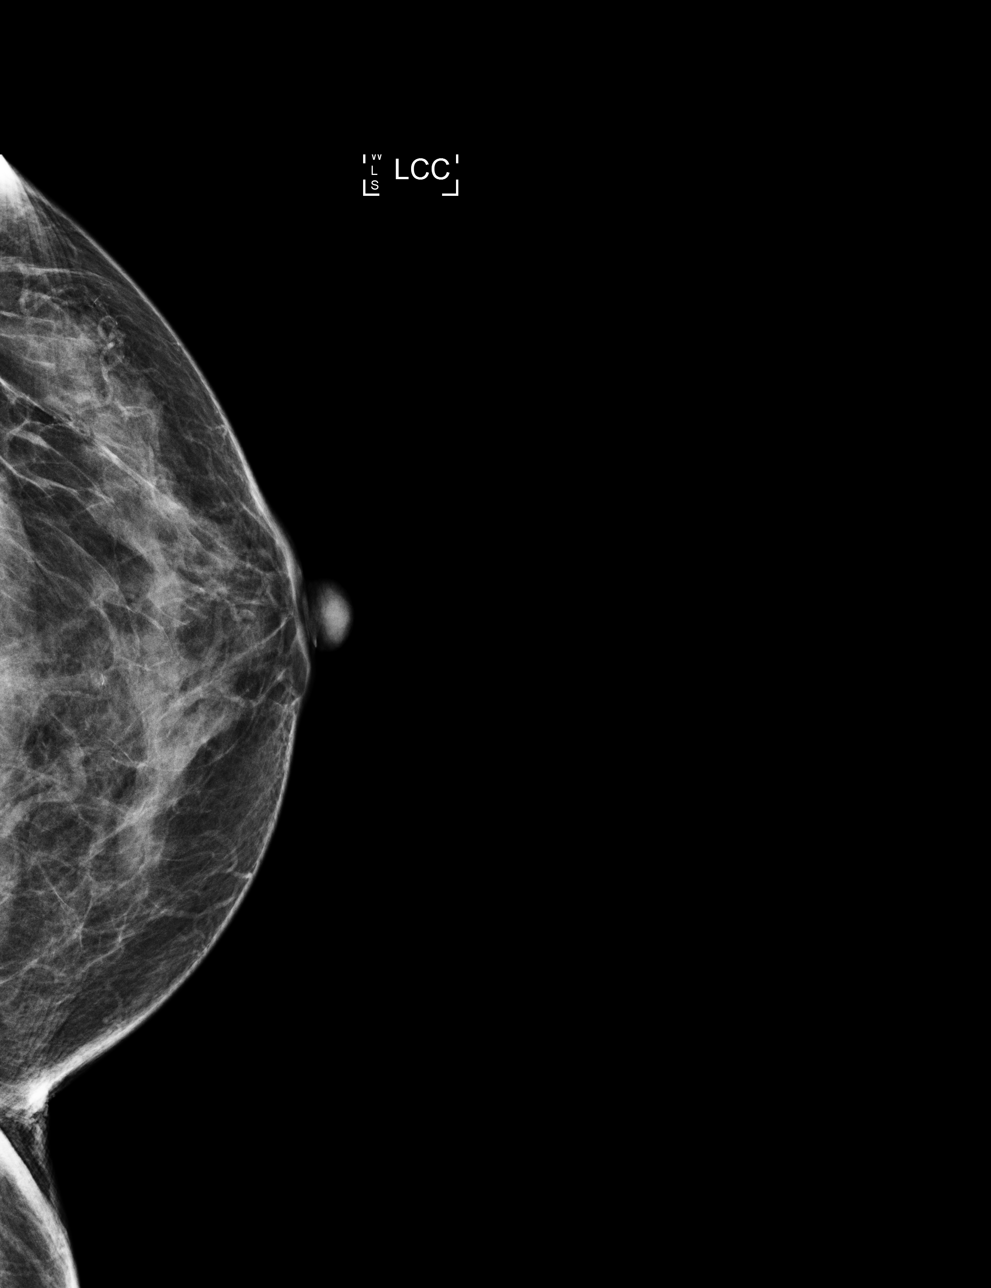

[R CC]
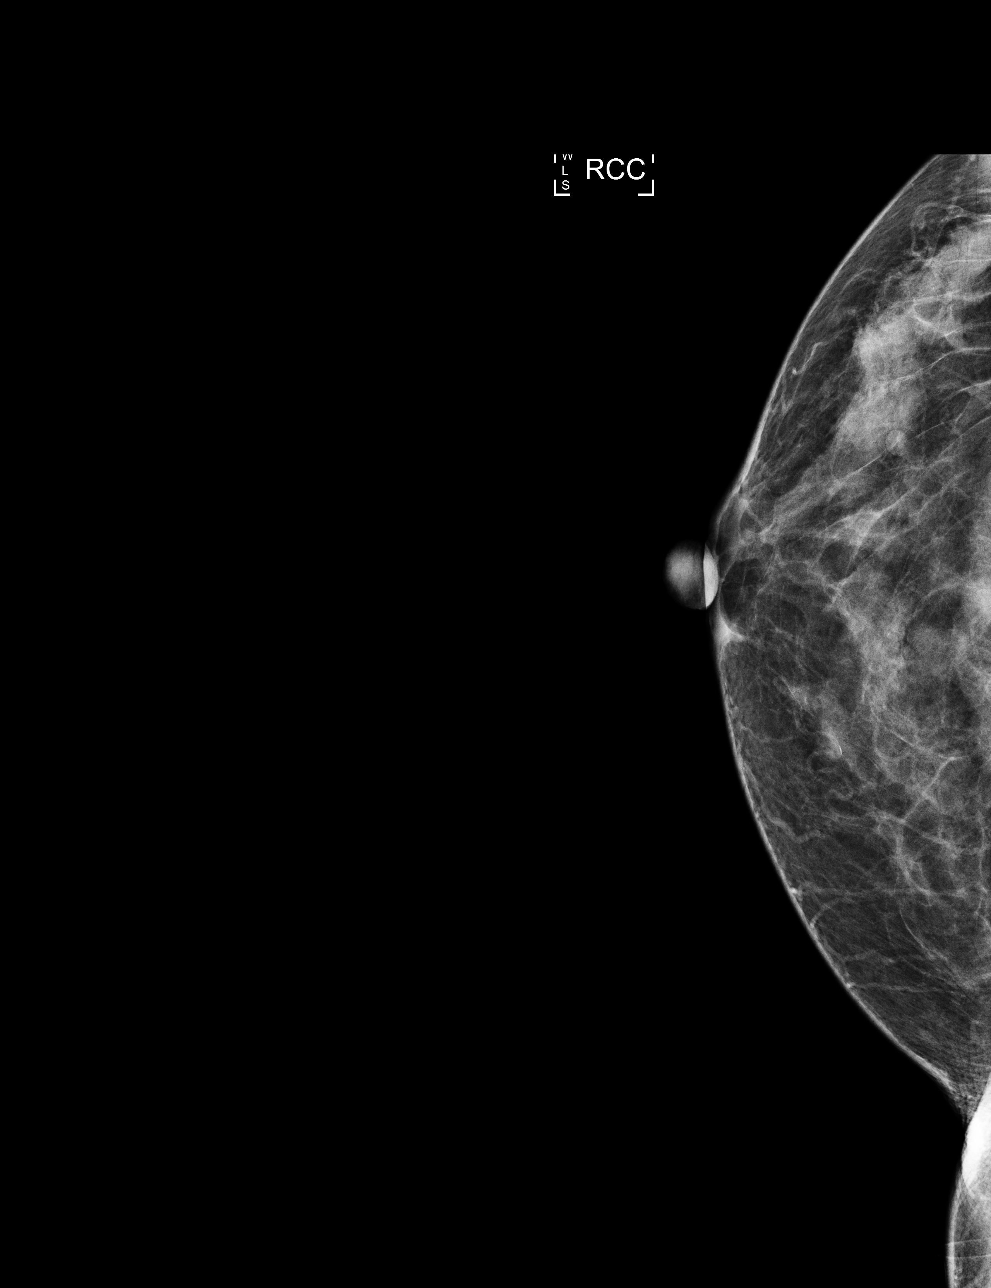

[L MLO]
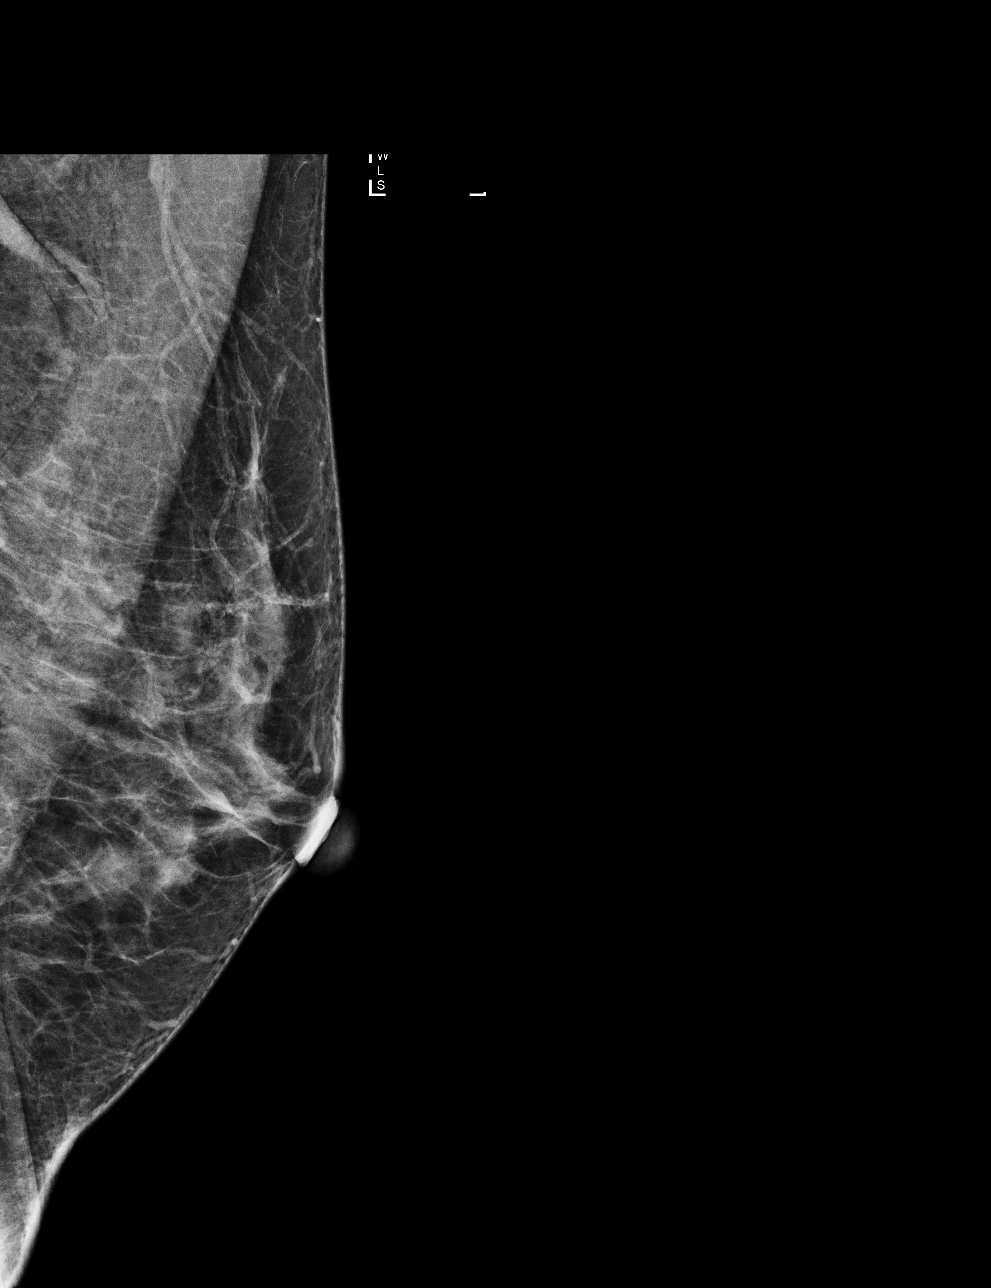

[4 of 4 positions shown; findings below may reference images not displayed]

ACR Breast Density Category b: There are scattered areas of
fibroglandular density.
FINDINGS: There are no findings suspicious for malignancy. Images were
processed with CAD.
IMPRESSION: No mammographic evidence of malignancy. A result letter of this
screening mammogram will be mailed directly to the patient.

RECOMMENDATION:
Screening mammogram in one year. (Code:[US])

BI-RADS CATEGORY  1: Negative.

## 2019-08-30 ENCOUNTER — Other Ambulatory Visit: Payer: Self-pay | Admitting: Obstetrics and Gynecology

## 2019-08-30 DIAGNOSIS — M8580 Other specified disorders of bone density and structure, unspecified site: Secondary | ICD-10-CM

## 2019-09-23 ENCOUNTER — Other Ambulatory Visit: Payer: 59

## 2019-11-18 ENCOUNTER — Ambulatory Visit
Admission: RE | Admit: 2019-11-18 | Discharge: 2019-11-18 | Disposition: A | Discharge status: Home / Self Care | Payer: 59 | Source: Ambulatory Visit | Attending: Obstetrics and Gynecology | Admitting: Obstetrics and Gynecology

## 2019-11-18 ENCOUNTER — Other Ambulatory Visit: Payer: Self-pay

## 2019-11-18 DIAGNOSIS — M8580 Other specified disorders of bone density and structure, unspecified site: Secondary | ICD-10-CM

## 2020-02-21 ENCOUNTER — Encounter: Payer: Self-pay | Admitting: Genetic Counselor

## 2020-05-11 NOTE — Progress Notes (Signed)
64 y.o. [id number] Married White or Caucasian Not Hispanic or Latino female here for annual exam.  H/o hysterectomy. No vaginal bleeding, no dyspareunia. No change in constipation, uses miralax daily which helps. BM 3 x a day currently, soft.  No urinary c/o.     Mother with breast cancer in her 19's, patient with negative BRCA testing.   She see's her primary yearly.   Patient's last menstrual period was 11/08/2001 (approximate).          Sexually active: Yes.    The current method of family planning is status post hysterectomy.    Exercising: Yes.    yoga Smoker:  no  Health Maintenance: Pap:  Unsure  History of abnormal Pap:  no MMG:  08/21/19 Density Cat b, Birads 1 Neg  BMD:   11/18/19 Osteopenic, T score -2, FRAX 7.7/0.5% Colonoscopy: 06/10/15, was not completed. She will discuss cologuard with her primary TDaP:  04/11/15 Gardasil: NA   reports that she has never smoked. She has never used smokeless tobacco. She reports that she does not drink alcohol and does not use drugs. She is a homemaker, substitute teaches. 5 kids, 18 grand children and one due later this month. 1 kids are local, rest are further away.  Past Medical History:  Diagnosis Date  . BRCA negative 01/2010   BRCA I/ II negative, done secondary to mother's history of Breast Cancer at age 72  . Celiac sprue 3/07  . History of depression   . Osteopenia   . Thyroid disease    hypothyroid    Past Surgical History:  Procedure Laterality Date  . CESAREAN SECTION  1987  . COLONOSCOPY  10/04/05   celiac  . ESOPHAGOGASTRODUODENOSCOPY ENDOSCOPY  10/04/05   Biopsy shows celiac  . FEMORAL HERNIA REPAIR Right 07/2004  . VAGINAL DELIVERY     x4  . VAGINAL HYSTERECTOMY  11/27/2001   secondary to adenomyosis, ovaries remain    Current Outpatient Medications  Medication Sig Dispense Refill  . ALPRAZolam (XANAX) 0.25 MG tablet Take 0.25 mg by mouth at bedtime as needed for sleep.    Marland Kitchen buPROPion (WELLBUTRIN SR) 150 MG 12  hr tablet Take 2 tablets by mouth daily.  0  . Calcium-Magnesium-Vitamin D (CALCIUM 500 PO) Take 1 tablet by mouth daily.    . Cholecalciferol (VITAMIN D3) 2000 UNITS TABS Take 1 tablet by mouth daily.    Marland Kitchen diltiazem (CARDIZEM CD) 360 MG 24 hr capsule     . docusate sodium (COLACE) 100 MG capsule Take 100 mg by mouth 2 (two) times daily.    . Glucosamine-Chondroit-Vit C-Mn (GLUCOSAMINE CHONDR 1500 COMPLX) CAPS Take 2 capsules by mouth daily.    Marland Kitchen levothyroxine (SYNTHROID, LEVOTHROID) 50 MCG tablet     . Multiple Vitamin (MULTIVITAMIN) tablet Take 1 tablet by mouth daily.    . Omega-3 Fatty Acids (FISH OIL) 1000 MG CAPS Take 4 capsules by mouth daily.    Marland Kitchen PARoxetine (PAXIL) 10 MG tablet Take 10 mg by mouth every morning.    . polyethylene glycol (MIRALAX / GLYCOLAX) packet Take 17 g by mouth daily.    . traZODone (DESYREL) 150 MG tablet Take 0.5 tablets by mouth daily.  0  . triamterene-hydrochlorothiazide (MAXZIDE-25) 37.5-25 MG tablet Take 1 tablet by mouth daily.  0   No current facility-administered medications for this visit.    Family History  Problem Relation Age of Onset  . Breast cancer Mother 2       second time  postmenopausal  . Prostate cancer Father   . Chronic Renal Failure Father   . Hypertension Brother   . Breast cancer Maternal Aunt        postmenopausal    Review of Systems  All other systems reviewed and are negative.   Exam:   BP 132/74   Pulse 68   Ht '5\' 3"'  (1.6 m)   Wt 139 lb 9.6 oz (63.3 kg)   LMP 11/08/2001 (Approximate)   SpO2 98%   BMI 24.73 kg/m   Weight change: '@WEIGHTCHANGE' @ Height:   Height: '5\' 3"'  (160 cm)  Ht Readings from Last 3 Encounters:  05/20/20 '5\' 3"'  (1.6 m)  05/15/19 '5\' 4"'  (1.626 m)  05/04/17 5' 3.75" (1.619 m)    General appearance: alert, cooperative and appears stated age Head: Normocephalic, without obvious abnormality, atraumatic Neck: no adenopathy, supple, symmetrical, trachea midline and thyroid normal to inspection and  palpation Breasts: in the left breast at the periphery at 7-8 o'clock are 2 smooth, mobile, <1 cm adjoining lumps. Not tender. No skin chages  Abdomen: soft, non-tender; non distended,  no masses,  no organomegaly Extremities: extremities normal, atraumatic, no cyanosis or edema Skin: Skin color, texture, turgor normal. No rashes or lesions Lymph nodes: Cervical, supraclavicular, and axillary nodes normal. No abnormal inguinal nodes palpated Neurologic: Grossly normal   Pelvic: External genitalia:  no lesions              Urethra:  normal appearing urethra with no masses, tenderness or lesions              Bartholins and Skenes: normal                 Vagina: normal appearing vagina with normal color and discharge, no lesions              Cervix: absent               Bimanual Exam:  Uterus:  uterus absent              Adnexa: no mass, fullness, tenderness               Rectovaginal: Confirms               Anus:  normal sphincter tone, no lesions    A:  Well Woman with normal exam  Osteopenia  FH breast cancer  Breast lump 7-8 o'clock on the left  P:   No pap needed  Screening mammogram in 2/21, will set her up for diagnostic mammogram left breast now  DEXA in 5/23  Labs and cologuard with her primary  Discussed breast self exam  Discussed calcium and vit D intake   In addition to the breast and pelvic exam

## 2020-05-20 ENCOUNTER — Encounter: Payer: Self-pay | Admitting: Obstetrics and Gynecology

## 2020-05-20 ENCOUNTER — Other Ambulatory Visit: Payer: Self-pay

## 2020-05-20 ENCOUNTER — Other Ambulatory Visit: Payer: Self-pay | Admitting: Obstetrics and Gynecology

## 2020-05-20 ENCOUNTER — Ambulatory Visit (INDEPENDENT_AMBULATORY_CARE_PROVIDER_SITE_OTHER): Payer: Medicare Other | Admitting: Obstetrics and Gynecology

## 2020-05-20 ENCOUNTER — Telehealth: Payer: Self-pay

## 2020-05-20 VITALS — BP 132/74 | HR 68 | Ht 63.0 in | Wt 139.6 lb

## 2020-05-20 DIAGNOSIS — Z803 Family history of malignant neoplasm of breast: Secondary | ICD-10-CM | POA: Diagnosis not present

## 2020-05-20 DIAGNOSIS — N6324 Unspecified lump in the left breast, lower inner quadrant: Secondary | ICD-10-CM

## 2020-05-20 DIAGNOSIS — Z01411 Encounter for gynecological examination (general) (routine) with abnormal findings: Secondary | ICD-10-CM

## 2020-05-20 DIAGNOSIS — M8580 Other specified disorders of bone density and structure, unspecified site: Secondary | ICD-10-CM

## 2020-05-20 NOTE — Patient Instructions (Addendum)
EXERCISE AND DIET:  We recommended that you start or continue a regular exercise program for good health. Regular exercise means any activity that makes your heart beat faster and makes you sweat.  We recommend exercising at least 30 minutes per day at least 3 days a week, preferably 4 or 5.  We also recommend a diet low in fat and sugar.  Inactivity, poor dietary choices and obesity can cause diabetes, heart attack, stroke, and kidney damage, among others.    ALCOHOL AND SMOKING:  Women should limit their alcohol intake to no more than 7 drinks/beers/glasses of wine (combined, not each!) per week. Moderation of alcohol intake to this level decreases your risk of breast cancer and liver damage. And of course, no recreational drugs are part of a healthy lifestyle.  And absolutely no smoking or even second hand smoke. Most people know smoking can cause heart and lung diseases, but did you know it also contributes to weakening of your bones? Aging of your skin?  Yellowing of your teeth and nails?  CALCIUM AND VITAMIN D:  Adequate intake of calcium and Vitamin D are recommended.  The recommendations for exact amounts of these supplements seem to change often, but generally speaking 1,200 mg of calcium (between diet and supplement) and 800 units of Vitamin D per day seems prudent. Certain women may benefit from higher intake of Vitamin D.  If you are among these women, your doctor will have told you during your visit.    PAP SMEARS:  Pap smears, to check for cervical cancer or precancers,  have traditionally been done yearly, although recent scientific advances have shown that most women can have pap smears less often.  However, every woman still should have a physical exam from her gynecologist every year. It will include a breast check, inspection of the vulva and vagina to check for abnormal growths or skin changes, a visual exam of the cervix, and then an exam to evaluate the size and shape of the uterus and  ovaries.  And after 65 years of age, a rectal exam is indicated to check for rectal cancers. We will also provide age appropriate advice regarding health maintenance, like when you should have certain vaccines, screening for sexually transmitted diseases, bone density testing, colonoscopy, mammograms, etc.   MAMMOGRAMS:  All women over 40 years old should have a yearly mammogram. Many facilities now offer a "3D" mammogram, which may cost around $50 extra out of pocket. If possible,  we recommend you accept the option to have the 3D mammogram performed.  It both reduces the number of women who will be called back for extra views which then turn out to be normal, and it is better than the routine mammogram at detecting truly abnormal areas.    COLON CANCER SCREENING: Now recommend starting at age 45. At this time colonoscopy is not covered for routine screening until 50. There are take home tests that can be done between 45-49.   COLONOSCOPY:  Colonoscopy to screen for colon cancer is recommended for all women at age 50.  We know, you hate the idea of the prep.  We agree, BUT, having colon cancer and not knowing it is worse!!  Colon cancer so often starts as a polyp that can be seen and removed at colonscopy, which can quite literally save your life!  And if your first colonoscopy is normal and you have no family history of colon cancer, most women don't have to have it again for   10 years.  Once every ten years, you can do something that may end up saving your life, right?  We will be happy to help you get it scheduled when you are ready.  Be sure to check your insurance coverage so you understand how much it will cost.  It may be covered as a preventative service at no cost, but you should check your particular policy.      Breast Self-Awareness Breast self-awareness means being familiar with how your breasts look and feel. It involves checking your breasts regularly and reporting any changes to your  health care provider. Practicing breast self-awareness is important. A change in your breasts can be a sign of a serious medical problem. Being familiar with how your breasts look and feel allows you to find any problems early, when treatment is more likely to be successful. All women should practice breast self-awareness, including women who have had breast implants. How to do a breast self-exam One way to learn what is normal for your breasts and whether your breasts are changing is to do a breast self-exam. To do a breast self-exam: Look for Changes  1. Remove all the clothing above your waist. 2. Stand in front of a mirror in a room with good lighting. 3. Put your hands on your hips. 4. Push your hands firmly downward. 5. Compare your breasts in the mirror. Look for differences between them (asymmetry), such as: ? Differences in shape. ? Differences in size. ? Puckers, dips, and bumps in one breast and not the other. 6. Look at each breast for changes in your skin, such as: ? Redness. ? Scaly areas. 7. Look for changes in your nipples, such as: ? Discharge. ? Bleeding. ? Dimpling. ? Redness. ? A change in position. Feel for Changes Carefully feel your breasts for lumps and changes. It is best to do this while lying on your back on the floor and again while sitting or standing in the shower or tub with soapy water on your skin. Feel each breast in the following way:  Place the arm on the side of the breast you are examining above your head.  Feel your breast with the other hand.  Start in the nipple area and make  inch (2 cm) overlapping circles to feel your breast. Use the pads of your three middle fingers to do this. Apply light pressure, then medium pressure, then firm pressure. The light pressure will allow you to feel the tissue closest to the skin. The medium pressure will allow you to feel the tissue that is a little deeper. The firm pressure will allow you to feel the tissue  close to the ribs.  Continue the overlapping circles, moving downward over the breast until you feel your ribs below your breast.  Move one finger-width toward the center of the body. Continue to use the  inch (2 cm) overlapping circles to feel your breast as you move slowly up toward your collarbone.  Continue the up and down exam using all three pressures until you reach your armpit.  Write Down What You Find  Write down what is normal for each breast and any changes that you find. Keep a written record with breast changes or normal findings for each breast. By writing this information down, you do not need to depend only on memory for size, tenderness, or location. Write down where you are in your menstrual cycle, if you are still menstruating. If you are having trouble noticing differences   in your breasts, do not get discouraged. With time you will become more familiar with the variations in your breasts and more comfortable with the exam. How often should I examine my breasts? Examine your breasts every month. If you are breastfeeding, the best time to examine your breasts is after a feeding or after using a breast pump. If you menstruate, the best time to examine your breasts is 5-7 days after your period is over. During your period, your breasts are lumpier, and it may be more difficult to notice changes. When should I see my health care provider? See your health care provider if you notice:  A change in shape or size of your breasts or nipples.  A change in the skin of your breast or nipples, such as a reddened or scaly area.  Unusual discharge from your nipples.  A lump or thick area that was not there before.  Pain in your breasts.  Anything that concerns you.   Osteopenia  Osteopenia is a loss of thickness (density) inside of the bones. Another name for osteopenia is low bone mass. Mild osteopenia is a normal part of aging. It is not a disease, and it does not cause  symptoms. However, if you have osteopenia and continue to lose bone mass, you could develop a condition that causes the bones to become thin and break more easily (osteoporosis). You may also lose some height, have back pain, and have a stooped posture. Although osteopenia is not a disease, making changes to your lifestyle and diet can help to prevent osteopenia from developing into osteoporosis. What are the causes? Osteopenia is caused by loss of calcium in the bones.  Bones are constantly changing. Old bone cells are continually being replaced with new bone cells. This process builds new bone. The mineral calcium is needed to build new bone and maintain bone density. Bone density is usually highest around age 28. After that, most people's bodies cannot replace all the bone they have lost with new bone. What increases the risk? You are more likely to develop this condition if:  You are older than age 11.  You are a woman who went through menopause early.  You have a long illness that keeps you in bed.  You do not get enough exercise.  You lack certain nutrients (malnutrition).  You have an overactive thyroid gland (hyperthyroidism).  You smoke.  You drink a lot of alcohol.  You are taking medicines that weaken the bones, such as steroids. What are the signs or symptoms? This condition does not cause any symptoms. You may have a slightly higher risk for bone breaks (fractures), so getting fractures more easily than normal may be an indication of osteopenia. How is this diagnosed? Your health care provider can diagnose this condition with a special type of X-ray exam that measures bone density (dual-energy X-ray absorptiometry, DEXA). This test can measure bone density in your hips, spine, and wrists. Osteopenia has no symptoms, so this condition is usually diagnosed after a routine bone density screening test is done for osteoporosis. This routine screening is usually done for:  Women  who are age 22 or older.  Men who are age 59 or older. If you have risk factors for osteopenia, you may have the screening test at an earlier age. How is this treated? Making dietary and lifestyle changes can lower your risk for osteoporosis. If you have severe osteopenia that is close to becoming osteoporosis, your health care provider may prescribe medicines  and dietary supplements such as calcium and vitamin D. These supplements help to rebuild bone density. Follow these instructions at home:   Take over-the-counter and prescription medicines only as told by your health care provider. These include vitamins and supplements.  Eat a diet that is high in calcium and vitamin D. ? Calcium is found in dairy products, beans, salmon, and leafy green vegetables like spinach and broccoli. ? Look for foods that have vitamin D and calcium added to them (fortified foods), such as orange juice, cereal, and bread.  Do 30 or more minutes of a weight-bearing exercise every day, such as walking, jogging, or playing a sport. These types of exercises strengthen the bones.  Take precautions at home to lower your risk of falling, such as: ? Keeping rooms well-lit and free of clutter, such as cords. ? Installing safety rails on stairs. ? Using rubber mats in the bathroom or other areas that are often wet or slippery.  Do not use any products that contain nicotine or tobacco, such as cigarettes and e-cigarettes. If you need help quitting, ask your health care provider.  Avoid alcohol or limit alcohol intake to no more than 1 drink a day for nonpregnant women and 2 drinks a day for men. One drink equals 12 oz of beer, 5 oz of wine, or 1 oz of hard liquor.  Keep all follow-up visits as told by your health care provider. This is important. Contact a health care provider if:  You have not had a bone density screening for osteoporosis and you are: ? A woman, age 49 or older. ? A man, age 65 or older.  You  are a postmenopausal woman who has not had a bone density screening for osteoporosis.  You are older than age 2 and you want to know if you should have bone density screening for osteoporosis. Summary  Osteopenia is a loss of thickness (density) inside of the bones. Another name for osteopenia is low bone mass.  Osteopenia is not a disease, but it may increase your risk for a condition that causes the bones to become thin and break more easily (osteoporosis).  You may be at risk for osteopenia if you are older than age 60 or if you are a woman who went through early menopause.  Osteopenia does not cause any symptoms, but it can be diagnosed with a bone density screening test.  Dietary and lifestyle changes are the first treatment for osteopenia. These may lower your risk for osteoporosis. This information is not intended to replace advice given to you by your health care provider. Make sure you discuss any questions you have with your health care provider. Document Revised: 06/09/2017 Document Reviewed: 04/05/2017 Elsevier Patient Education  2020 [employer].

## 2020-05-20 NOTE — Telephone Encounter (Signed)
-----   Message from Salvadore Dom, MD sent at 05/20/2020 11:14 AM EST ----- Please schedule the patient for diagnostic breast imaging on the left at 7-8 o'clock near the periphery.  Thanks, Sharee Pimple

## 2020-05-20 NOTE — Telephone Encounter (Signed)
Call placed to [facility] for Dx MMG and [place] on left breast.  Spoke with [facility] and scheduled 06/18/20 at 2:20 pm.   Call placed to pt. Spoke with pt. Pt given appt information. Pt agreeable and verbalized understanding of date and time of appt. Pt to return call to [facility] if needs rescheduling.   Routing to Dr Talbert Nan for update and review Orders placed by [facility] for co-sign.  Cc: Sharee Pimple, RN for [facility] hold Encounter closed

## 2020-05-20 NOTE — Telephone Encounter (Signed)
Patient placed in MMG hold.  

## 2020-06-18 ENCOUNTER — Ambulatory Visit
Admission: RE | Admit: 2020-06-18 | Discharge: 2020-06-18 | Disposition: A | Discharge status: Home / Self Care | Payer: 59 | Source: Ambulatory Visit | Attending: Obstetrics and Gynecology | Admitting: Obstetrics and Gynecology

## 2020-06-18 ENCOUNTER — Other Ambulatory Visit: Payer: Self-pay

## 2020-06-18 ENCOUNTER — Ambulatory Visit
Admission: RE | Admit: 2020-06-18 | Discharge: 2020-06-18 | Disposition: A | Discharge status: Home / Self Care | Payer: Medicare Other | Source: Ambulatory Visit | Attending: Obstetrics and Gynecology | Admitting: Obstetrics and Gynecology

## 2020-06-18 DIAGNOSIS — N6324 Unspecified lump in the left breast, lower inner quadrant: Secondary | ICD-10-CM

## 2020-06-18 IMAGING — MG MM DIGITAL DIAGNOSTIC UNILAT*L* W/ TOMO W/ CAD
4 series · 4 of 12 positions shown · non-contrast
Comparison: Previous exam(s).

CLINICAL DATA: 64-year-old female with palpable lump in the LOWER
INNER LEFT breast identified on clinical examination.

EXAM:
DIGITAL DIAGNOSTIC LEFT MAMMOGRAM WITH CAD AND TOMO
ULTRASOUND LEFT BREAST

[L MLO synth-2D]
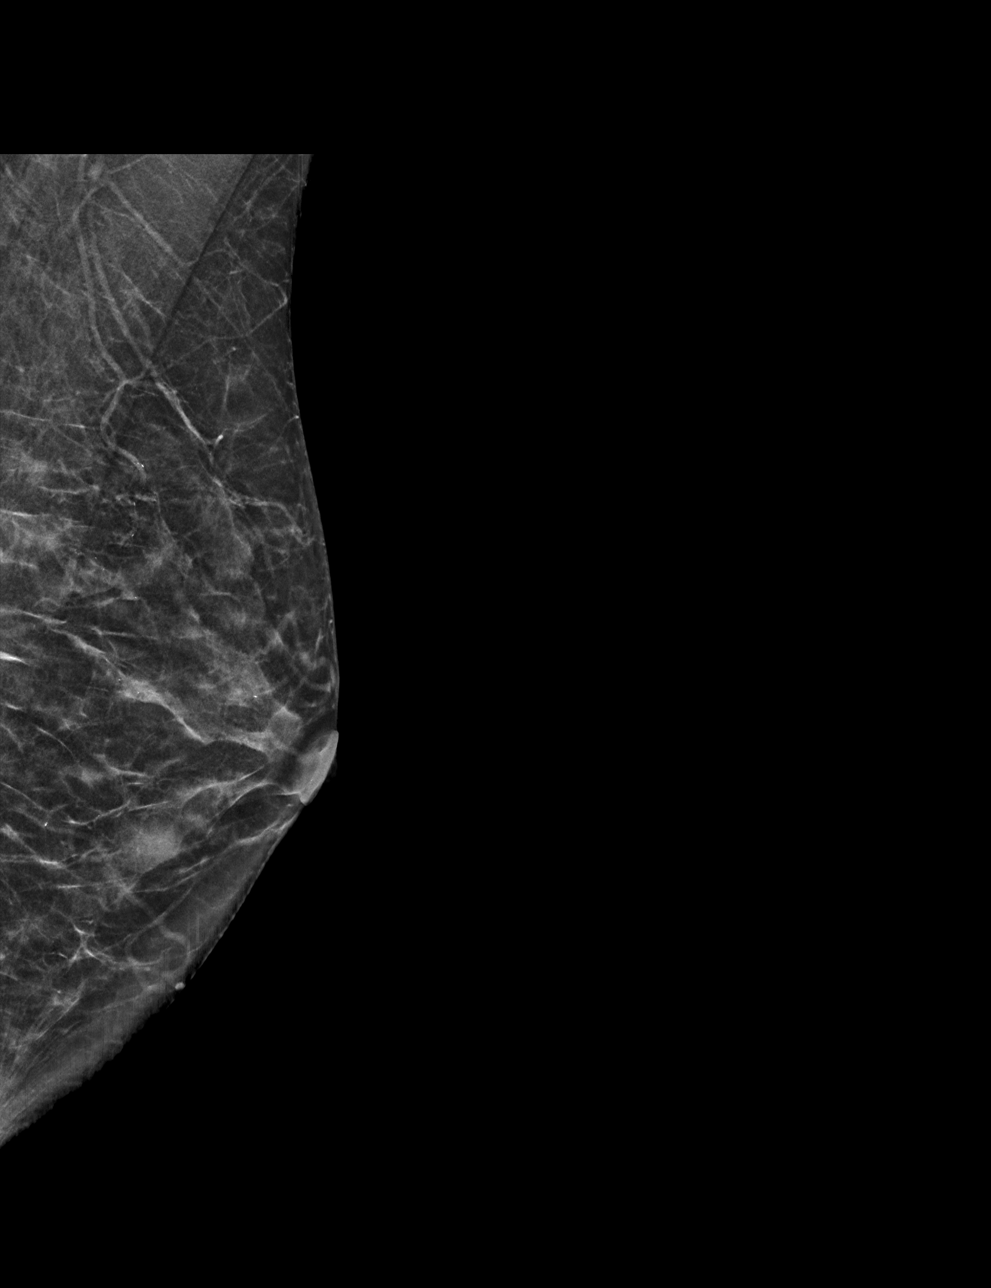

[L CC synth-2D]
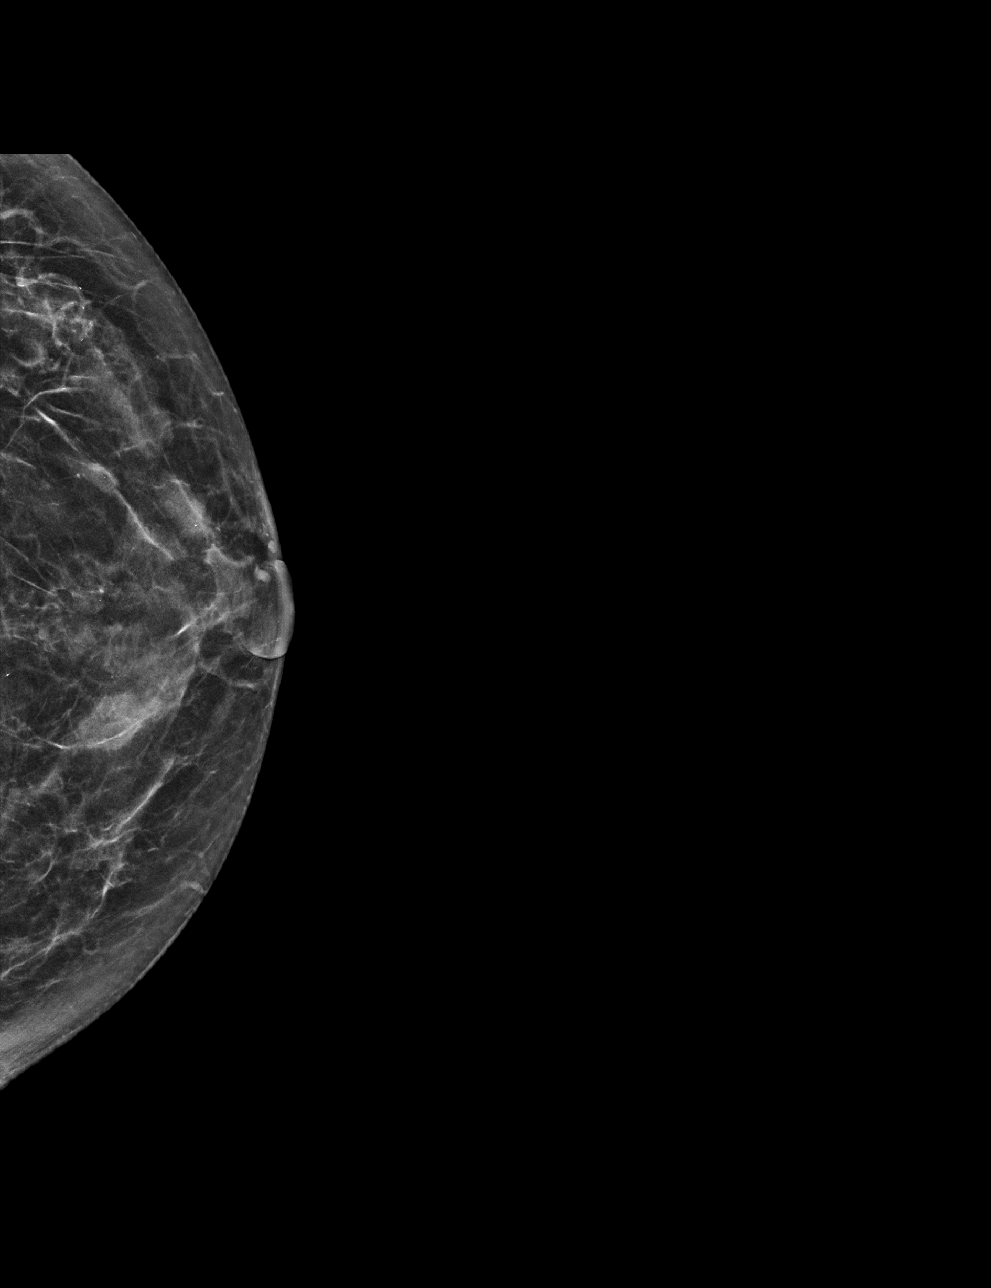

[L MLO tomo · tomo slice 23/44.0]
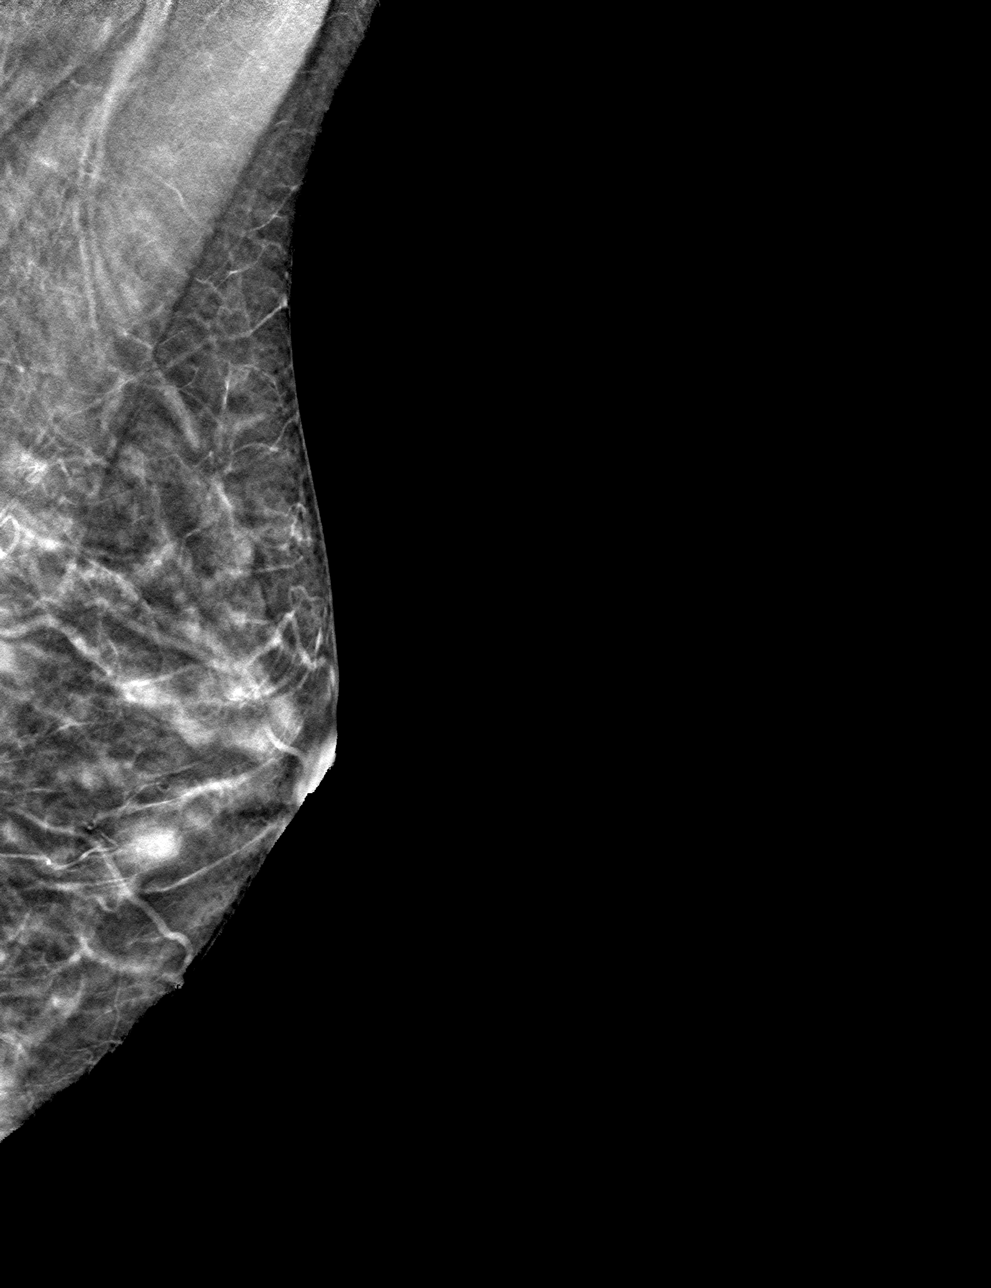

[L CC tomo · tomo slice 23/45.0]
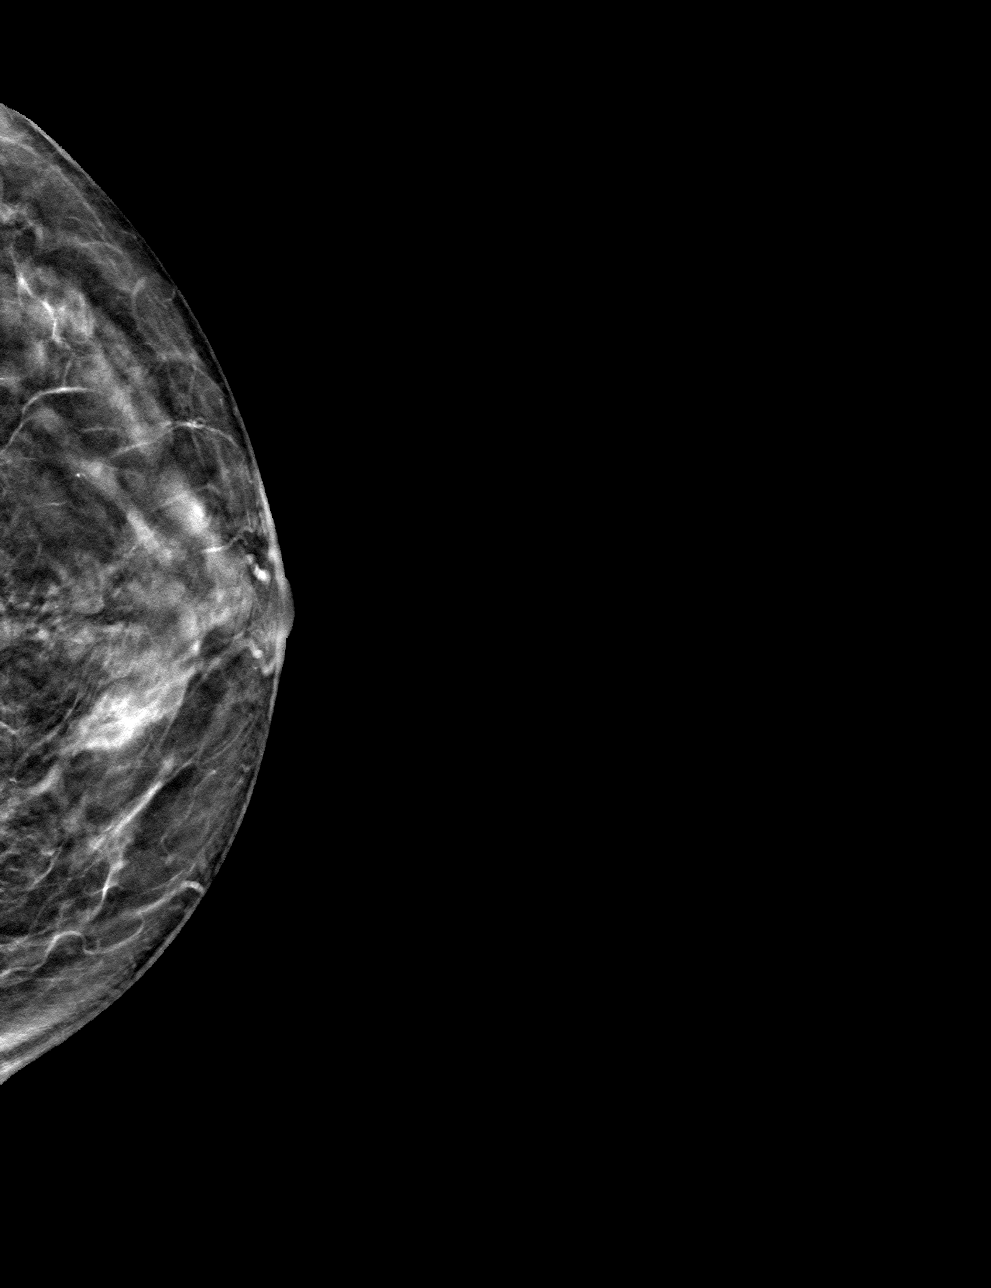

[4 of 12 positions shown; findings below may reference images not displayed]

ACR Breast Density Category c: The breast tissue is heterogeneously
dense, which may obscure small masses.
FINDINGS: 2D/3D full field views of the LEFT breast demonstrate a
circumscribed oval mass within the LOWER INNER LEFT breast.

No other mass, distortion or worrisome calcifications are noted.

Mammographic images were processed with CAD.

On physical exam, palpable thickening in the LOWER INNER LEFT
breast.

Targeted ultrasound is performed, showing a 0.7 x 0.4 x 0.9 cm
benign cyst at the 8 o'clock position of the LEFT breast 1 cm from
the nipple. No other sonographic abnormalities are noted within the
LOWER or INNER LEFT breast.
IMPRESSION: 1. 0.9 cm benign cyst within the LOWER INNER LEFT breast.
2. No other mammographic or sonographic abnormalities within the
LEFT breast.

RECOMMENDATION:
Bilateral screening mammogram in 2 months to resume annual mammogram
schedule.

I have discussed the findings and recommendations with the patient.
If applicable, a reminder letter will be sent to the patient
regarding the next appointment.

BI-RADS CATEGORY  2: Benign.

## 2020-06-18 IMAGING — US US BREAST*L* LIMITED INC AXILLA
1 series · 6 of 6 positions shown · non-contrast
Comparison: Previous exam(s).

CLINICAL DATA: 64-year-old female with palpable lump in the LOWER
INNER LEFT breast identified on clinical examination.

EXAM:
DIGITAL DIAGNOSTIC LEFT MAMMOGRAM WITH CAD AND TOMO
ULTRASOUND LEFT BREAST

[Series 1: us breast*left* limited inc axilla · 0.06mm/px · 6 of 6 slices shown]
[im 1/6]
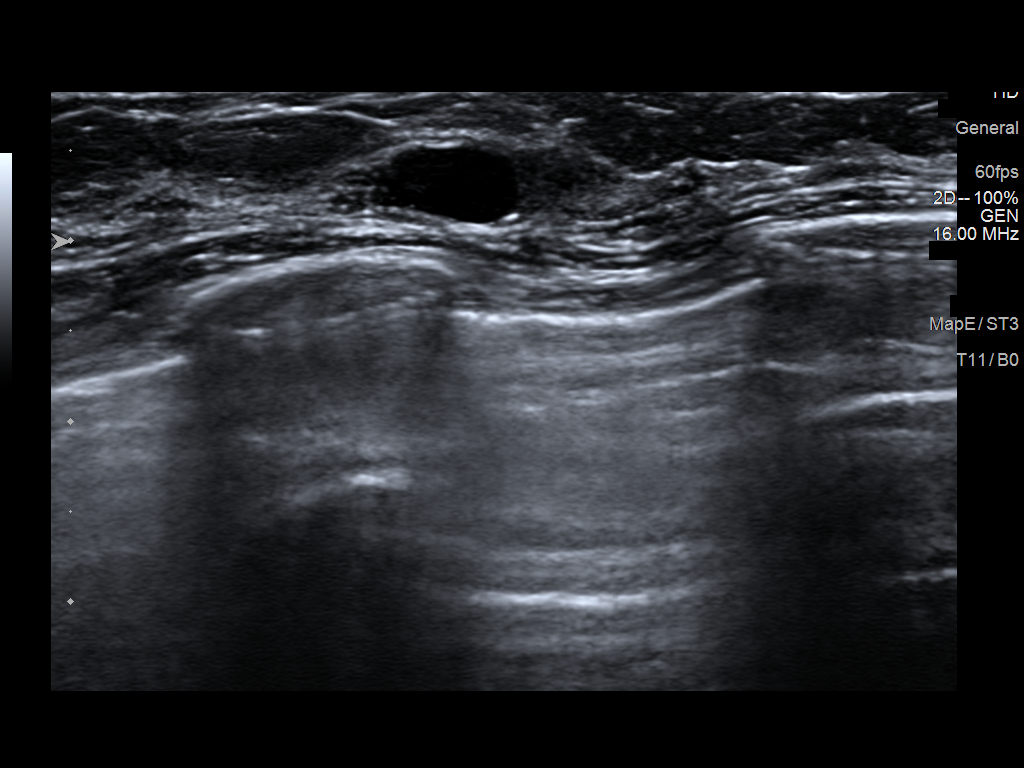
[im 2/6]
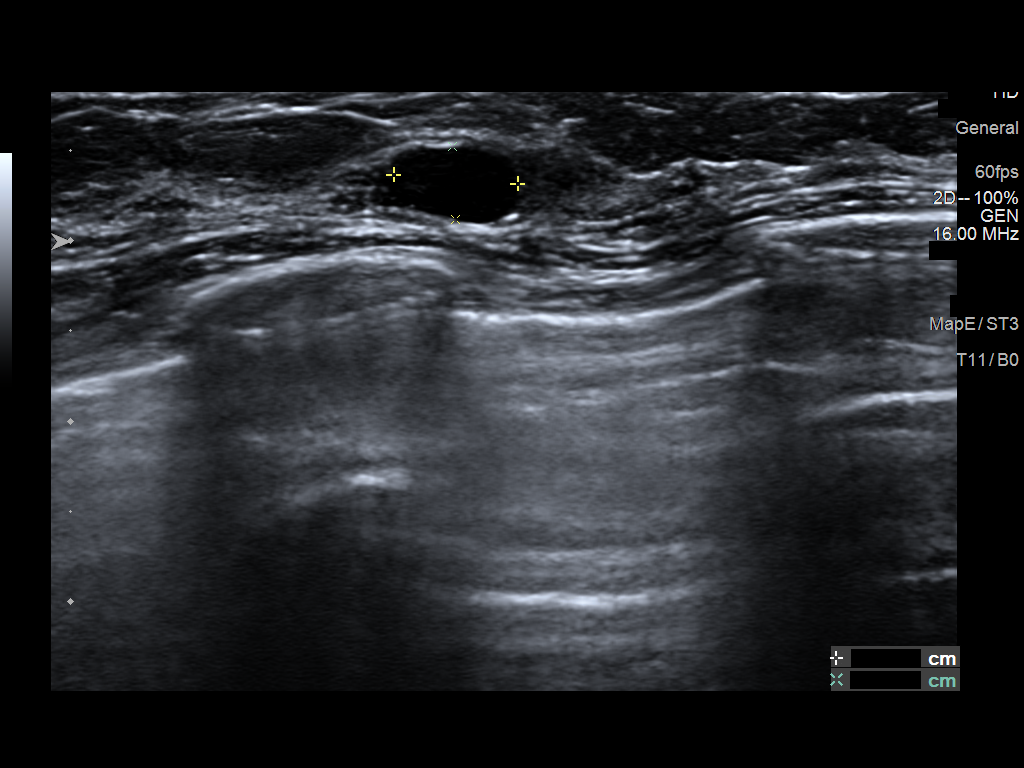
[im 3/6]
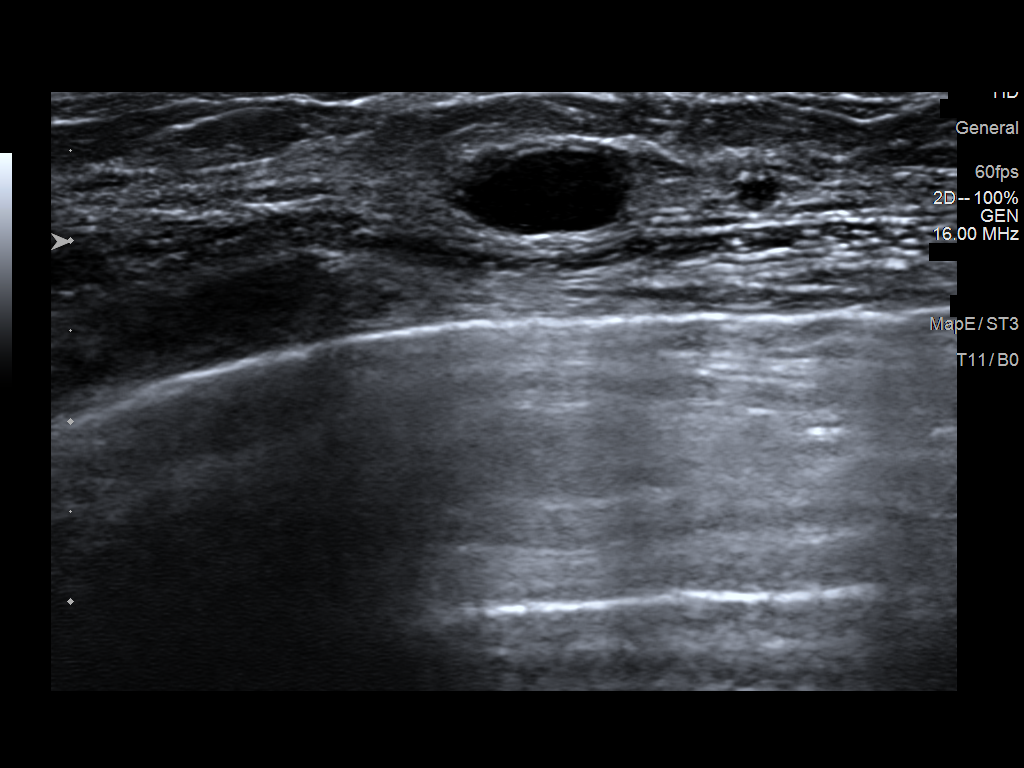
[im 4/6]
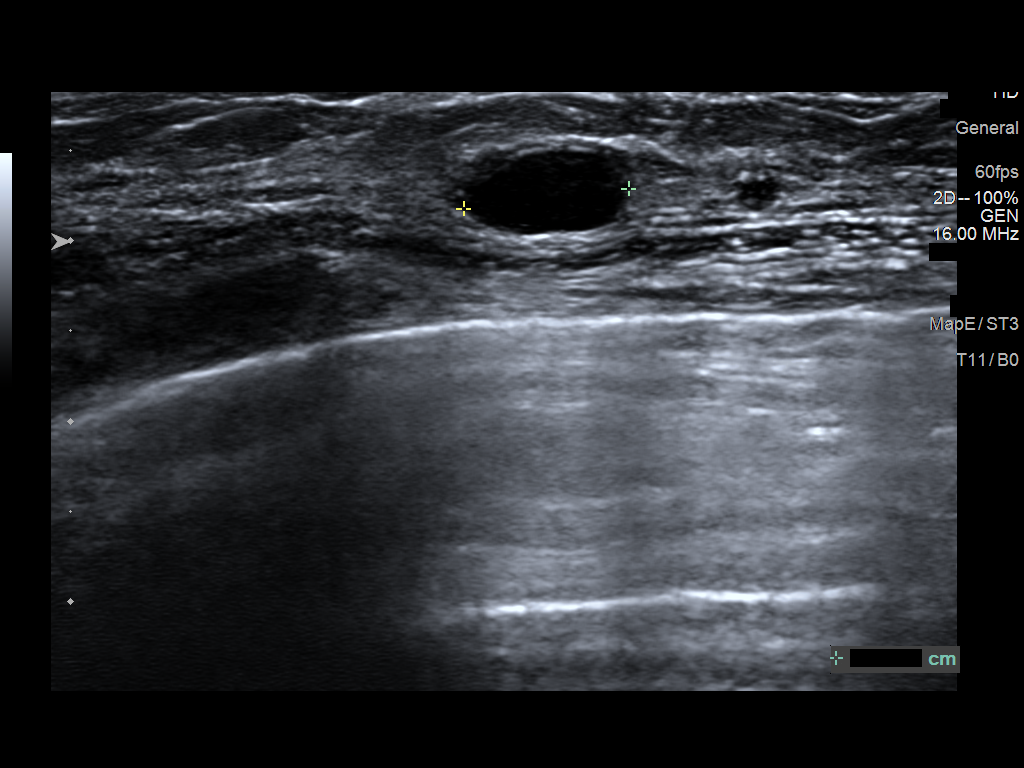
[im 5/6]
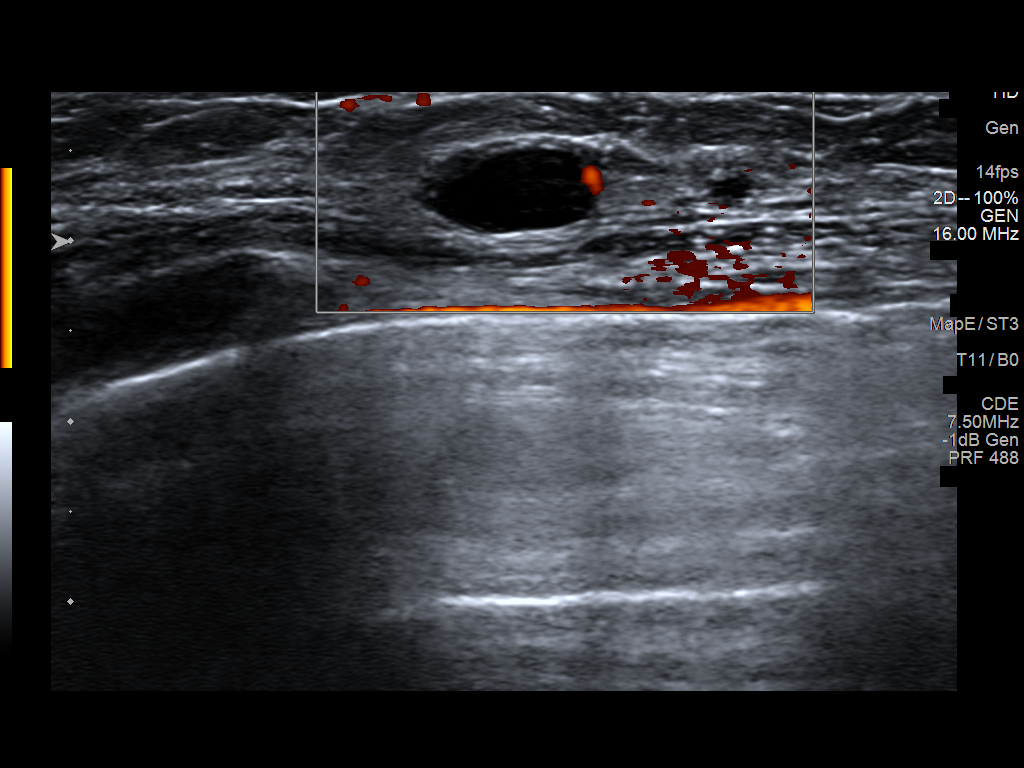
[im 6/6]
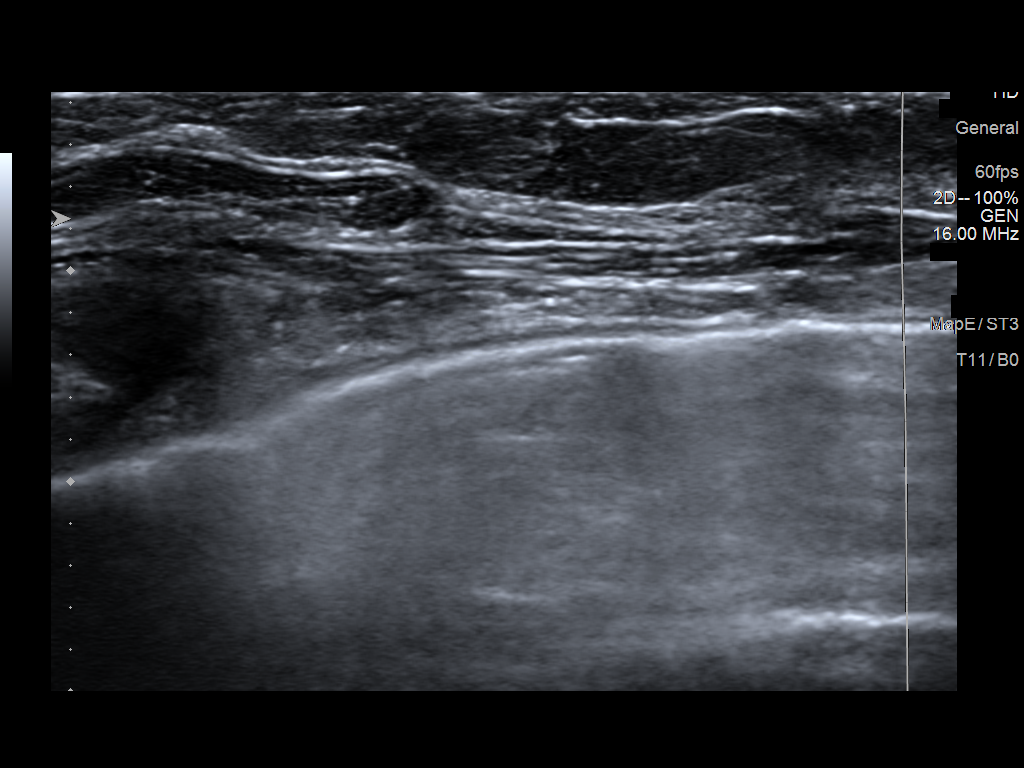

[6 of 6 positions shown; findings below may reference images not displayed]

ACR Breast Density Category c: The breast tissue is heterogeneously
dense, which may obscure small masses.
FINDINGS: 2D/3D full field views of the LEFT breast demonstrate a
circumscribed oval mass within the LOWER INNER LEFT breast.

No other mass, distortion or worrisome calcifications are noted.

Mammographic images were processed with CAD.

On physical exam, palpable thickening in the LOWER INNER LEFT
breast.

Targeted ultrasound is performed, showing a 0.7 x 0.4 x 0.9 cm
benign cyst at the 8 o'clock position of the LEFT breast 1 cm from
the nipple. No other sonographic abnormalities are noted within the
LOWER or INNER LEFT breast.
IMPRESSION: 1. 0.9 cm benign cyst within the LOWER INNER LEFT breast.
2. No other mammographic or sonographic abnormalities within the
LEFT breast.

RECOMMENDATION:
Bilateral screening mammogram in 2 months to resume annual mammogram
schedule.

I have discussed the findings and recommendations with the patient.
If applicable, a reminder letter will be sent to the patient
regarding the next appointment.

BI-RADS CATEGORY  2: Benign.

## 2020-07-16 DIAGNOSIS — U071 COVID-19: Secondary | ICD-10-CM | POA: Diagnosis not present

## 2020-07-28 DIAGNOSIS — L814 Other melanin hyperpigmentation: Secondary | ICD-10-CM | POA: Diagnosis not present

## 2020-07-28 DIAGNOSIS — D239 Other benign neoplasm of skin, unspecified: Secondary | ICD-10-CM | POA: Diagnosis not present

## 2020-07-28 DIAGNOSIS — D2272 Melanocytic nevi of left lower limb, including hip: Secondary | ICD-10-CM | POA: Diagnosis not present

## 2020-07-28 DIAGNOSIS — Z85828 Personal history of other malignant neoplasm of skin: Secondary | ICD-10-CM | POA: Diagnosis not present

## 2020-07-28 DIAGNOSIS — L578 Other skin changes due to chronic exposure to nonionizing radiation: Secondary | ICD-10-CM | POA: Diagnosis not present

## 2020-07-28 DIAGNOSIS — L821 Other seborrheic keratosis: Secondary | ICD-10-CM | POA: Diagnosis not present

## 2020-07-28 DIAGNOSIS — D225 Melanocytic nevi of trunk: Secondary | ICD-10-CM | POA: Diagnosis not present

## 2020-07-28 DIAGNOSIS — L918 Other hypertrophic disorders of the skin: Secondary | ICD-10-CM | POA: Diagnosis not present

## 2020-07-28 DIAGNOSIS — D1801 Hemangioma of skin and subcutaneous tissue: Secondary | ICD-10-CM | POA: Diagnosis not present

## 2020-08-04 DIAGNOSIS — I10 Essential (primary) hypertension: Secondary | ICD-10-CM | POA: Diagnosis not present

## 2020-08-04 DIAGNOSIS — N183 Chronic kidney disease, stage 3 unspecified: Secondary | ICD-10-CM | POA: Diagnosis not present

## 2020-08-04 DIAGNOSIS — E039 Hypothyroidism, unspecified: Secondary | ICD-10-CM | POA: Diagnosis not present

## 2020-08-04 DIAGNOSIS — E7800 Pure hypercholesterolemia, unspecified: Secondary | ICD-10-CM | POA: Diagnosis not present

## 2020-08-04 DIAGNOSIS — R69 Illness, unspecified: Secondary | ICD-10-CM | POA: Diagnosis not present

## 2020-08-04 DIAGNOSIS — M8580 Other specified disorders of bone density and structure, unspecified site: Secondary | ICD-10-CM | POA: Diagnosis not present

## 2020-09-10 ENCOUNTER — Encounter: Payer: Self-pay | Admitting: Obstetrics and Gynecology

## 2020-09-10 ENCOUNTER — Other Ambulatory Visit: Payer: Self-pay

## 2020-09-10 ENCOUNTER — Ambulatory Visit: Payer: Medicare HMO | Admitting: Obstetrics and Gynecology

## 2020-09-10 VITALS — BP 130/76 | HR 61 | Ht 64.25 in | Wt 140.4 lb

## 2020-09-10 DIAGNOSIS — N6324 Unspecified lump in the left breast, lower inner quadrant: Secondary | ICD-10-CM

## 2020-09-10 NOTE — Progress Notes (Signed)
GYNECOLOGY  VISIT   HPI: 65 y.o.   Married White or Caucasian Not Hispanic or Latino  female   [phone] with Patient's last menstrual period was 11/08/2001 (approximate).   here for   3 months breast follow up  At her annual exam in 11/21 she was noted to have a left breast lump: in the left breast at the periphery at 7-8 o'clock are 2 smooth, mobile, <1 cm adjoining lumps. Not tender. No skin chages  Breast imaging was benign.   GYNECOLOGIC HISTORY: Patient's last menstrual period was 11/08/2001 (approximate). Contraception: pmp Menopausal hormone therapy: none         OB History    Gravida  5   Para  5   Term  5   Preterm  0   AB  0   Living  5     SAB  0   IAB  0   Ectopic  0   Multiple  0   Live Births  5              There are no problems to display for this patient.   Past Medical History:  Diagnosis Date  . BRCA negative 01/2010   BRCA I/ II negative, done secondary to mother's history of Breast Cancer at age 41  . Celiac sprue 3/07  . History of depression   . Osteopenia   . Thyroid disease    hypothyroid    Past Surgical History:  Procedure Laterality Date  . CESAREAN SECTION  1987  . COLONOSCOPY  10/04/05   celiac  . ESOPHAGOGASTRODUODENOSCOPY ENDOSCOPY  10/04/05   Biopsy shows celiac  . FEMORAL HERNIA REPAIR Right 07/2004  . VAGINAL DELIVERY     x4  . VAGINAL HYSTERECTOMY  11/27/2001   secondary to adenomyosis, ovaries remain    Current Outpatient Medications  Medication Sig Dispense Refill  . ALPRAZolam (XANAX) 0.25 MG tablet Take 0.25 mg by mouth at bedtime as needed for sleep.    Marland Kitchen buPROPion (WELLBUTRIN SR) 150 MG 12 hr tablet Take 2 tablets by mouth daily.  0  . Calcium-Magnesium-Vitamin D (CALCIUM 500 PO) Take 1 tablet by mouth daily.    . Cholecalciferol (VITAMIN D3) 2000 UNITS TABS Take 1 tablet by mouth daily.    Marland Kitchen diltiazem (CARDIZEM CD) 360 MG 24 hr capsule     . docusate sodium (COLACE) 100 MG capsule Take 100 mg by mouth  2 (two) times daily.    . Glucosamine-Chondroit-Vit C-Mn (GLUCOSAMINE CHONDR 1500 COMPLX) CAPS Take 2 capsules by mouth daily.    Marland Kitchen levothyroxine (SYNTHROID, LEVOTHROID) 50 MCG tablet     . Multiple Vitamin (MULTIVITAMIN) tablet Take 1 tablet by mouth daily.    . Omega-3 Fatty Acids (FISH OIL) 1000 MG CAPS Take 4 capsules by mouth daily.    Marland Kitchen PARoxetine (PAXIL) 10 MG tablet Take 10 mg by mouth every morning.    . polyethylene glycol (MIRALAX / GLYCOLAX) packet Take 17 g by mouth daily.    . traZODone (DESYREL) 150 MG tablet Take 0.5 tablets by mouth daily.  0  . triamterene-hydrochlorothiazide (MAXZIDE-25) 37.5-25 MG tablet Take 1 tablet by mouth daily.  0   No current facility-administered medications for this visit.     ALLERGIES: Banana  Family History  Problem Relation Age of Onset  . Breast cancer Mother 37       second time postmenopausal  . Prostate cancer Father   . Chronic Renal Failure Father   .  Hypertension Brother   . Breast cancer Maternal Aunt        postmenopausal    Social History   Socioeconomic History  . Marital status: Married    Spouse name: Not on file  . Number of children: 5  . Years of education: Not on file  . Highest education level: Not on file  Occupational History  . Not on file  Tobacco Use  . Smoking status: Never Smoker  . Smokeless tobacco: Never Used  Substance and Sexual Activity  . Alcohol use: No  . Drug use: No  . Sexual activity: Yes    Partners: Male    Birth control/protection: Surgical    Comment: hysterectomy  Other Topics Concern  . Not on file  Social History Narrative  . Not on file   Social Determinants of Health   Financial Resource Strain: Not on file  Food Insecurity: Not on file  Transportation Needs: Not on file  Physical Activity: Not on file  Stress: Not on file  Social Connections: Not on file  Intimate Partner Violence: Not on file    Review of Systems  All other systems reviewed and are  negative.   PHYSICAL EXAMINATION:    BP 130/76   Pulse 61   Ht 5' 4.25" (1.632 m)   Wt 140 lb 6.4 oz (63.7 kg)   LMP 11/08/2001 (Approximate)   SpO2 98%   BMI 23.91 kg/m     General appearance: alert, cooperative and appears stated age Breasts: in the periphery of the left breast at 7-8 o'clock are 2 stable, smooth, mobile, <1 cm adjoing lumps. No tender, no other lumps, no skin changes.  Patient examined supine and sitting. No supraclavicular or axillary adenopathy.   1. Breast lump on left side at 7 o'clock position Stable exam Due for routine mammogram, she will call to schedule.

## 2020-09-23 DIAGNOSIS — G479 Sleep disorder, unspecified: Secondary | ICD-10-CM | POA: Diagnosis not present

## 2020-09-23 DIAGNOSIS — N183 Chronic kidney disease, stage 3 unspecified: Secondary | ICD-10-CM | POA: Diagnosis not present

## 2020-09-23 DIAGNOSIS — Z23 Encounter for immunization: Secondary | ICD-10-CM | POA: Diagnosis not present

## 2020-09-23 DIAGNOSIS — R69 Illness, unspecified: Secondary | ICD-10-CM | POA: Diagnosis not present

## 2020-09-23 DIAGNOSIS — E039 Hypothyroidism, unspecified: Secondary | ICD-10-CM | POA: Diagnosis not present

## 2020-09-23 DIAGNOSIS — E7800 Pure hypercholesterolemia, unspecified: Secondary | ICD-10-CM | POA: Diagnosis not present

## 2020-09-23 DIAGNOSIS — Z1159 Encounter for screening for other viral diseases: Secondary | ICD-10-CM | POA: Diagnosis not present

## 2020-09-23 DIAGNOSIS — Z0000 Encounter for general adult medical examination without abnormal findings: Secondary | ICD-10-CM | POA: Diagnosis not present

## 2020-09-23 DIAGNOSIS — I10 Essential (primary) hypertension: Secondary | ICD-10-CM | POA: Diagnosis not present

## 2020-10-12 ENCOUNTER — Observation Stay (HOSPITAL_BASED_OUTPATIENT_CLINIC_OR_DEPARTMENT_OTHER)
Admission: EM | Admit: 2020-10-12 | Discharge: 2020-10-14 | Disposition: A | Discharge status: Home / Self Care | Payer: Medicare HMO | Attending: Internal Medicine | Admitting: Internal Medicine

## 2020-10-12 ENCOUNTER — Other Ambulatory Visit: Payer: Self-pay

## 2020-10-12 ENCOUNTER — Encounter (HOSPITAL_BASED_OUTPATIENT_CLINIC_OR_DEPARTMENT_OTHER): Payer: Self-pay | Admitting: *Deleted

## 2020-10-12 ENCOUNTER — Emergency Department (HOSPITAL_BASED_OUTPATIENT_CLINIC_OR_DEPARTMENT_OTHER): Payer: Medicare HMO

## 2020-10-12 ENCOUNTER — Ambulatory Visit: Payer: Self-pay | Admitting: *Deleted

## 2020-10-12 DIAGNOSIS — Z20822 Contact with and (suspected) exposure to covid-19: Secondary | ICD-10-CM | POA: Diagnosis not present

## 2020-10-12 DIAGNOSIS — K6289 Other specified diseases of anus and rectum: Secondary | ICD-10-CM | POA: Diagnosis not present

## 2020-10-12 DIAGNOSIS — I10 Essential (primary) hypertension: Secondary | ICD-10-CM | POA: Insufficient documentation

## 2020-10-12 DIAGNOSIS — K5289 Other specified noninfective gastroenteritis and colitis: Principal | ICD-10-CM | POA: Insufficient documentation

## 2020-10-12 DIAGNOSIS — K529 Noninfective gastroenteritis and colitis, unspecified: Secondary | ICD-10-CM | POA: Diagnosis present

## 2020-10-12 DIAGNOSIS — K5900 Constipation, unspecified: Secondary | ICD-10-CM | POA: Diagnosis not present

## 2020-10-12 DIAGNOSIS — E039 Hypothyroidism, unspecified: Secondary | ICD-10-CM | POA: Insufficient documentation

## 2020-10-12 DIAGNOSIS — Z79899 Other long term (current) drug therapy: Secondary | ICD-10-CM | POA: Diagnosis not present

## 2020-10-12 DIAGNOSIS — E876 Hypokalemia: Secondary | ICD-10-CM | POA: Diagnosis not present

## 2020-10-12 DIAGNOSIS — K649 Unspecified hemorrhoids: Secondary | ICD-10-CM | POA: Diagnosis not present

## 2020-10-12 DIAGNOSIS — I700 Atherosclerosis of aorta: Secondary | ICD-10-CM | POA: Diagnosis not present

## 2020-10-12 HISTORY — DX: Essential (primary) hypertension: I10

## 2020-10-12 LAB — COMPREHENSIVE METABOLIC PANEL
ALT: 17 U/L (ref 0–44)
AST: 21 U/L (ref 15–41)
Albumin: 4 g/dL (ref 3.5–5.0)
Alkaline Phosphatase: 59 U/L (ref 38–126)
Anion gap: 11 (ref 5–15)
BUN: 14 mg/dL (ref 8–23)
CO2: 24 mmol/L (ref 22–32)
Calcium: 8.9 mg/dL (ref 8.9–10.3)
Chloride: 97 mmol/L — ABNORMAL LOW (ref 98–111)
Creatinine, Ser: 0.8 mg/dL (ref 0.44–1.00)
GFR, Estimated: >60 mL/min (ref >60)
Glucose, Bld: 114 mg/dL — ABNORMAL HIGH (ref 70–99)
Potassium: 2.7 mmol/L — CL (ref 3.5–5.1)
Sodium: 132 mmol/L — ABNORMAL LOW (ref 135–145)
Total Bilirubin: 0.5 mg/dL (ref 0.3–1.2)
Total Protein: 7.1 g/dL (ref 6.5–8.1)

## 2020-10-12 LAB — MAGNESIUM: Magnesium: 2 mg/dL (ref 1.7–2.4)

## 2020-10-12 LAB — CBC WITH DIFFERENTIAL/PLATELET
Abs Immature Granulocytes: 0.04 10*3/uL (ref 0.00–0.07)
Basophils Absolute: 0.1 10*3/uL (ref 0.0–0.1)
Basophils Relative: 1 %
Eosinophils Absolute: 0.1 10*3/uL (ref 0.0–0.5)
Eosinophils Relative: 1 %
HCT: 37.1 % (ref 36.0–46.0)
Hemoglobin: 13 g/dL (ref 12.0–15.0)
Immature Granulocytes: 0 %
Lymphocytes Relative: 10 %
Lymphs Abs: 1.4 10*3/uL (ref 0.7–4.0)
MCH: 31.9 pg (ref 26.0–34.0)
MCHC: 35 g/dL (ref 30.0–36.0)
MCV: 91.2 fL (ref 80.0–100.0)
Monocytes Absolute: 1.2 10*3/uL — ABNORMAL HIGH (ref 0.1–1.0)
Monocytes Relative: 9 %
Neutro Abs: 10.9 10*3/uL — ABNORMAL HIGH (ref 1.7–7.7)
Neutrophils Relative %: 79 %
Platelets: 285 10*3/uL (ref 150–400)
RBC: 4.07 MIL/uL (ref 3.87–5.11)
RDW: 13.4 % (ref 11.5–15.5)
WBC: 13.8 10*3/uL — ABNORMAL HIGH (ref 4.0–10.5)
nRBC: 0 % (ref 0.0–0.2)

## 2020-10-12 IMAGING — CT CT ABD-PELV W/ CM
2 of 5 series · 16 of 46 positions shown, 18 images · IV contrast (Omnipaque)
Comparison: None.

CLINICAL DATA: Abdominal distension. Constipation and rectal pain
from hemorrhoids.

EXAM:
CT ABDOMEN AND PELVIS WITH CONTRAST
TECHNIQUE: Multidetector CT imaging of the abdomen and pelvis was performed
using the standard protocol following bolus administration of
intravenous contrast.
CONTRAST:  100mL OMNIPAQUE IOHEXOL 300 MG/ML  SOLN

[Series 2: axial st · axial · 0.96mm/px · z∈[-402,-36]mm · 13 of 83 slices shown, 15 images]
[im 5/83  soft-tissue]
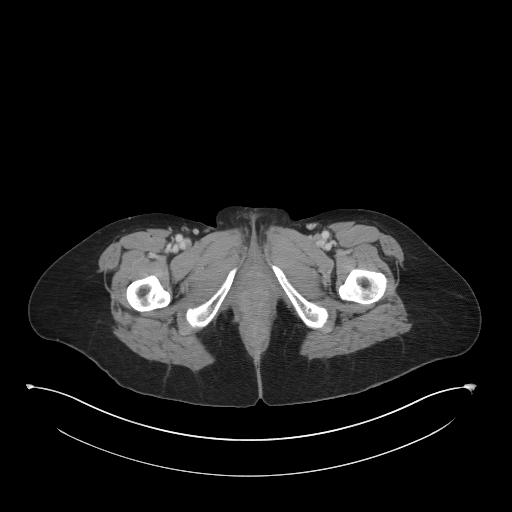
[im 5/83  bone]
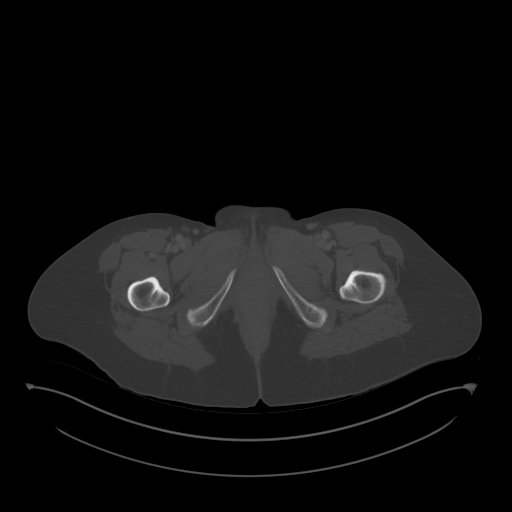
[im 13/83  soft-tissue]
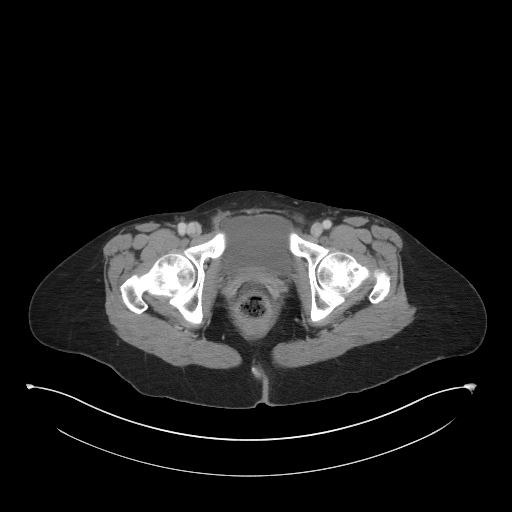
[im 18/83  soft-tissue]
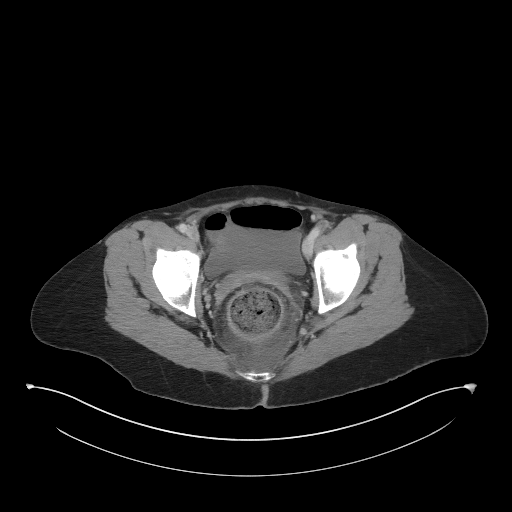
[im 22/83  soft-tissue]
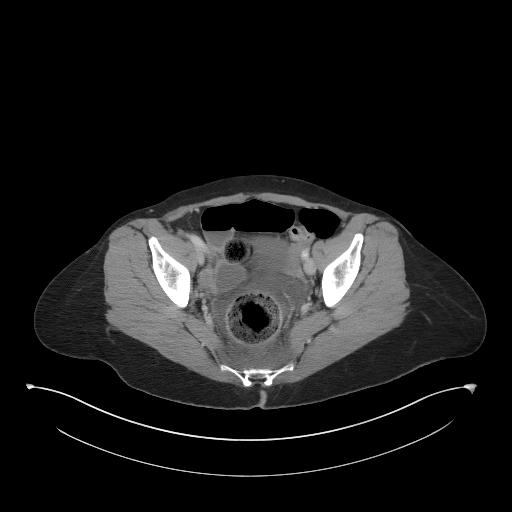
[im 31/83  soft-tissue]
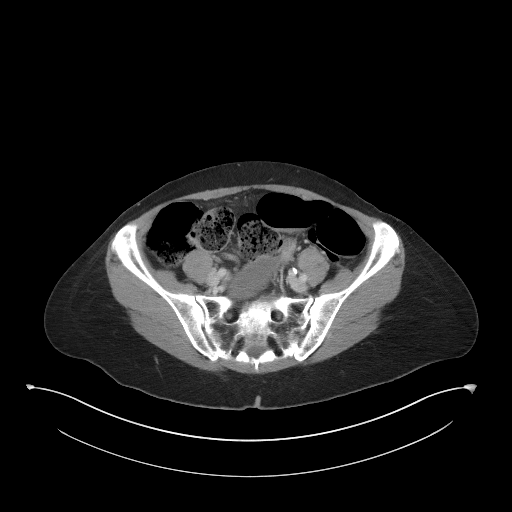
[im 35/83  soft-tissue]
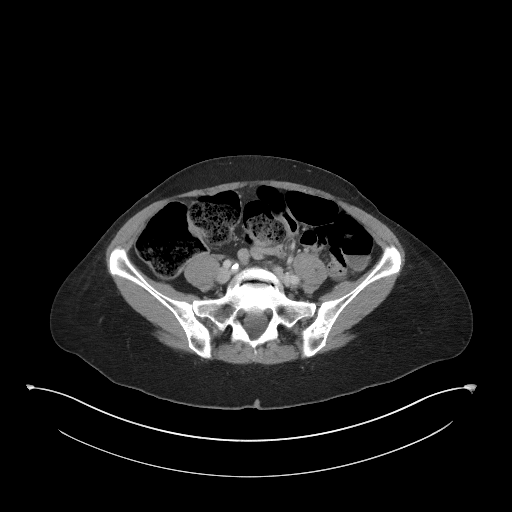
[im 44/83  soft-tissue]
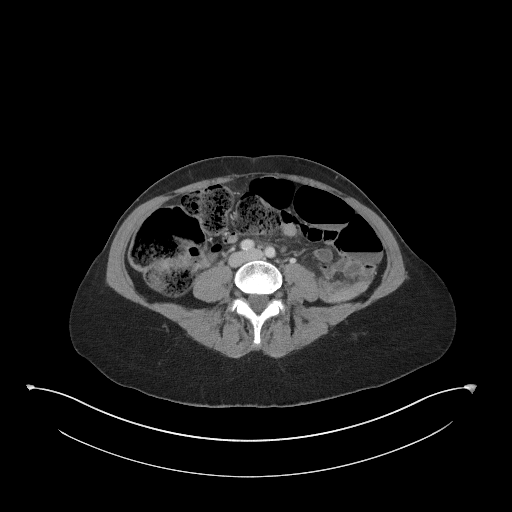
[im 48/83  soft-tissue]
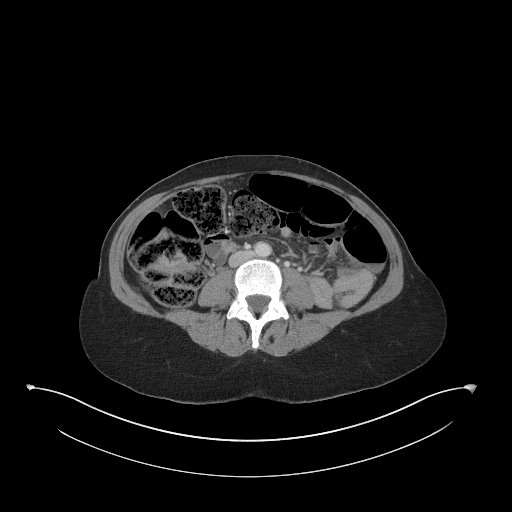
[im 52/83  soft-tissue]
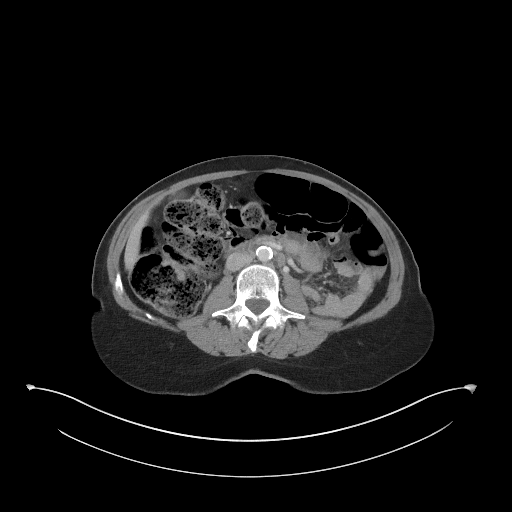
[im 52/83  bone]
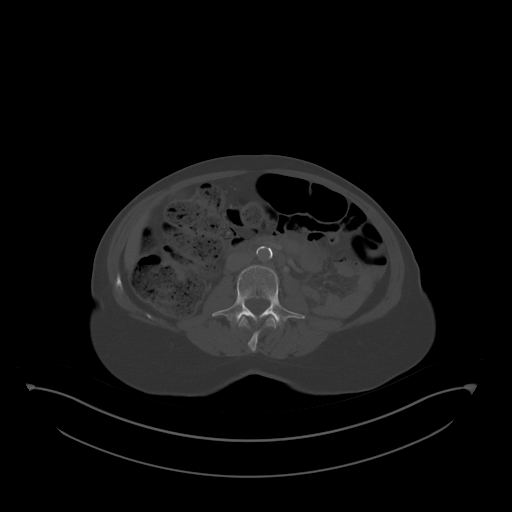
[im 61/83  soft-tissue]
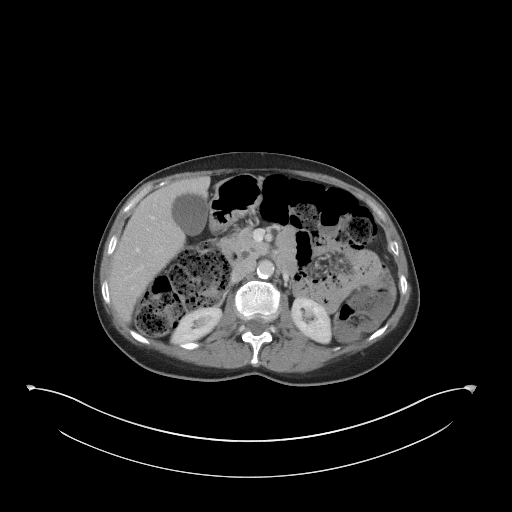
[im 65/83  soft-tissue]
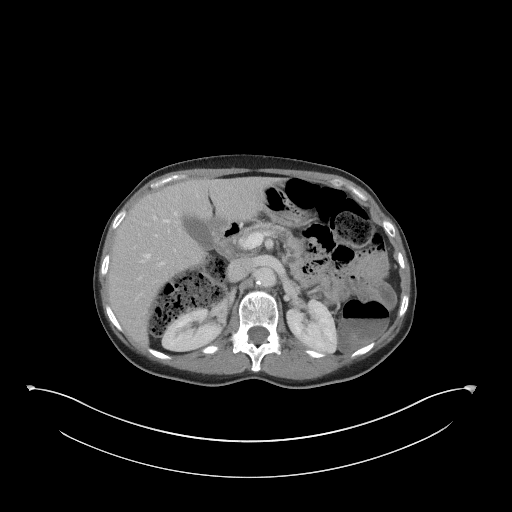
[im 70/83  soft-tissue]
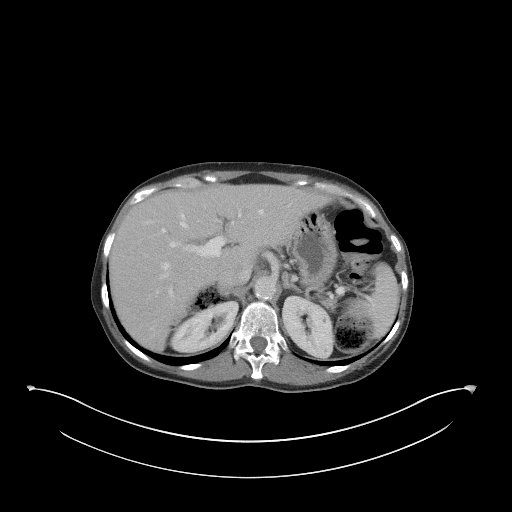
[im 78/83  soft-tissue]
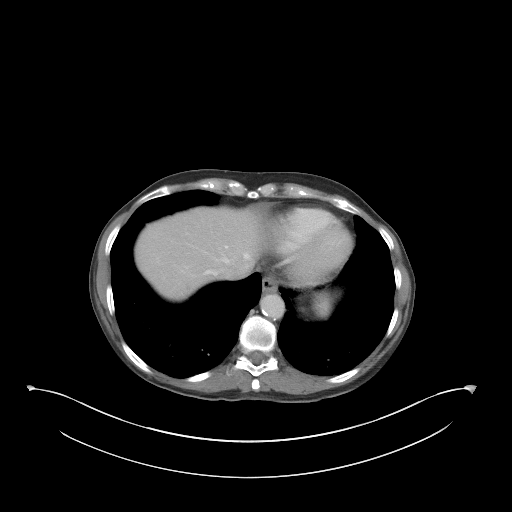

[Series 5: coronal st · coronal · 0.67mm/px · 3 of 76 slices shown]
[im 26/76  soft-tissue]
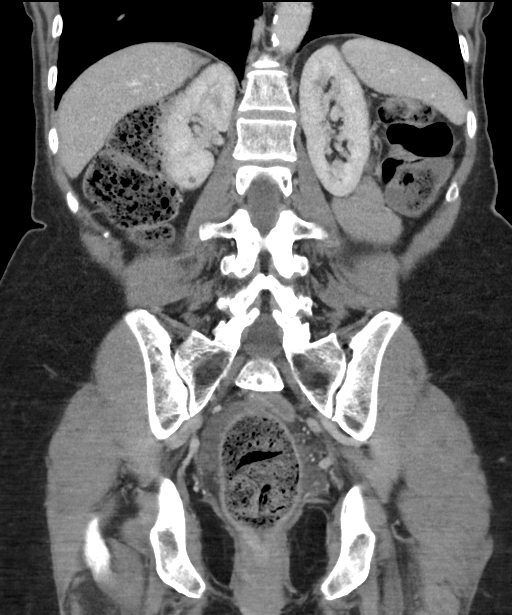
[im 34/76  soft-tissue]
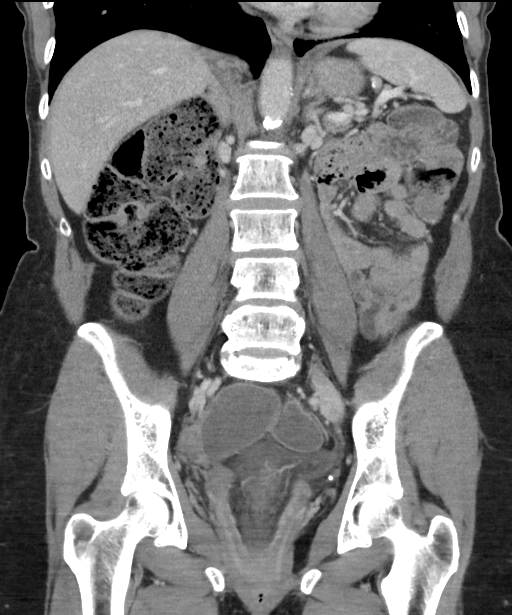
[im 42/76  soft-tissue]
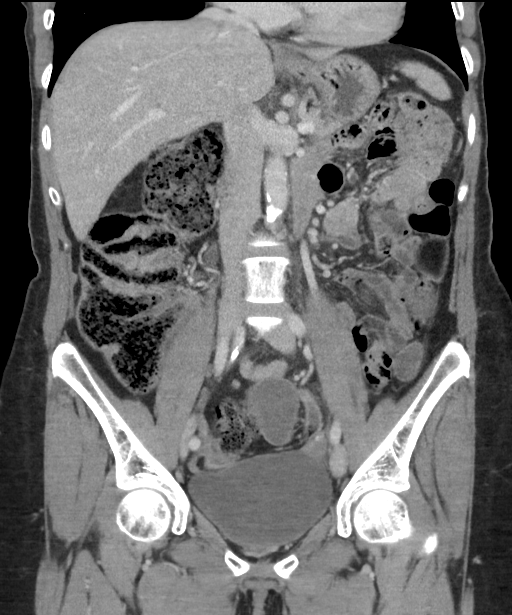

[16 of 46 positions shown; findings below may reference images not displayed]

FINDINGS: Lower chest: Bilateral lower lobe subsegmental atelectasis.

Hepatobiliary: Subcentimeter hypodensities are too small to
characterize. No focal liver abnormality. No gallstones, gallbladder
wall thickening, or pericholecystic fluid. No biliary dilatation.

Pancreas: No focal lesion. Normal pancreatic contour. No surrounding
inflammatory changes. The main pancreatic duct measures at the upper
limits of normal. Suggestion of several hypodense outpouching along
the main pancreatic duct that are poorly visualized ([DATE]).

Spleen: Normal in size without focal abnormality.

Adrenals/Urinary Tract:

No adrenal nodule bilaterally.

Bilateral kidneys enhance symmetrically. Subcentimeter hypodensities
are too small to characterize. No hydronephrosis. No hydroureter.

The urinary bladder is unremarkable.

Stomach/Bowel: Stomach is within normal limits. No evidence of bowel
wall thickening or dilatation. Stool throughout the colon. 5 cm
stool ball within the rectum with associated rectal wall thickening
and marked perirectal fat stranding. No definite pneumatosis.
Appendix appears normal.

Vascular/Lymphatic: No abdominal aorta or iliac aneurysm. Moderate
severe calcified and noncalcified atherosclerotic plaque of the
aorta and its branches. No abdominal, pelvic, or inguinal
lymphadenopathy.

Reproductive: Status post hysterectomy. No adnexal masses.

Other: No intraperitoneal free gas. No organized fluid collection.

Musculoskeletal:

No abdominal wall hernia or abnormality.

No suspicious lytic or blastic osseous lesions. No acute displaced
fracture.
IMPRESSION: 1. Constipation with rectal wall thickening and marked perirectal
fat stranding. Findings concerning for stercoral colitis. Underlying
malignancy is not excluded.
2. Main pancreatic duct measures at the upper limits of normal with
suggestion of a couple of hypodense outpouchings that could
represent side-branch intraductal papillary mucinous neoplasm.
Recommend MRI pancreatic protocol further evaluation

## 2020-10-12 MED ORDER — SORBITOL 70 % SOLN
960.0000 mL | TOPICAL_OIL | Freq: Once | ORAL | Status: DC
  Filled 2020-10-12: qty 240

## 2020-10-12 MED ORDER — LIDOCAINE HCL URETHRAL/MUCOSAL 2 % EX GEL
1.0000 "application " | Freq: Once | CUTANEOUS | Status: AC
  Administered 2020-10-12: 1 via TOPICAL
  Filled 2020-10-12: qty 11

## 2020-10-12 MED ORDER — IOHEXOL 300 MG/ML  SOLN
100.0000 mL | Freq: Once | INTRAMUSCULAR | Status: AC | PRN
  Administered 2020-10-12: 100 mL via INTRAVENOUS

## 2020-10-12 MED ORDER — POTASSIUM CHLORIDE 10 MEQ/100ML IV SOLN
10.0000 meq | INTRAVENOUS | Status: AC
  Administered 2020-10-12 – 2020-10-13 (×2): 10 meq via INTRAVENOUS
  Filled 2020-10-12 (×2): qty 100

## 2020-10-12 MED ORDER — SODIUM CHLORIDE 0.9 % IV BOLUS
500.0000 mL | Freq: Once | INTRAVENOUS | Status: AC
  Administered 2020-10-12: 500 mL via INTRAVENOUS

## 2020-10-12 MED ORDER — PIPERACILLIN-TAZOBACTAM 3.375 G IVPB 30 MIN
3.3750 g | Freq: Once | INTRAVENOUS | Status: AC
  Administered 2020-10-13: 3.375 g via INTRAVENOUS
  Filled 2020-10-12: qty 50

## 2020-10-12 MED ORDER — LORAZEPAM 2 MG/ML IJ SOLN
0.5000 mg | Freq: Once | INTRAMUSCULAR | Status: AC
  Administered 2020-10-12: 0.5 mg via INTRAVENOUS
  Filled 2020-10-12: qty 1

## 2020-10-12 NOTE — ED Notes (Signed)
Patient transported to CT 

## 2020-10-12 NOTE — ED Provider Notes (Signed)
[facility] EMERGENCY DEPARTMENT Provider Note   CSN: [id number] Arrival date & time: 10/12/20  1213     History Chief Complaint  Patient presents with  . Constipation    Cynthia Alexander is a 65 y.o. female.  The history is provided by the patient and medical records.  Constipation  Cynthia Alexander is a 65 y.o. female who presents to the Emergency Department complaining of rectal pain and constipation. She presents the emergency department complaining of rectal pain since yesterday. She has a lifelong history of constipation and usually takes MiraLAX daily. She recently traveled and did not take the medication for a couple of days. Yesterday when she went to have a bowel movement she had severe rectal pressure and sensation of fullness and was only able to pass a small amount of stool. Since that time she has attempted multiple methods to relieve her symptoms with animus as well as digital attempt to remove stool. She complains of severe rectal pain. No significant abdominal pain, vomiting, fevers.    Past Medical History:  Diagnosis Date  . BRCA negative 01/2010   BRCA I/ II negative, done secondary to mother's history of Breast Cancer at age 60  . Celiac sprue 3/07  . History of depression   . Osteopenia   . Thyroid disease    hypothyroid    Patient Active Problem List   Diagnosis Date Noted  . Colitis 10/12/2020    Past Surgical History:  Procedure Laterality Date  . CESAREAN SECTION  1987  . COLONOSCOPY  10/04/05   celiac  . ESOPHAGOGASTRODUODENOSCOPY ENDOSCOPY  10/04/05   Biopsy shows celiac  . FEMORAL HERNIA REPAIR Right 07/2004  . VAGINAL DELIVERY     x4  . VAGINAL HYSTERECTOMY  11/27/2001   secondary to adenomyosis, ovaries remain     OB History    Gravida  5   Para  5   Term  5   Preterm  0   AB  0   Living  5     SAB  0   IAB  0   Ectopic  0   Multiple  0   Live Births  5           Family History  Problem Relation  Age of Onset  . Breast cancer Mother 8       second time postmenopausal  . Prostate cancer Father   . Chronic Renal Failure Father   . Hypertension Brother   . Breast cancer Maternal Aunt        postmenopausal    Social History   Tobacco Use  . Smoking status: Never Smoker  . Smokeless tobacco: Never Used  Substance Use Topics  . Alcohol use: No  . Drug use: No    Home Medications Prior to Admission medications   Medication Sig Start Date End Date Taking? Authorizing Provider  ALPRAZolam Duanne Moron) 0.25 MG tablet Take 0.25 mg by mouth at bedtime as needed for sleep.    [provider]  buPROPion (WELLBUTRIN SR) 150 MG 12 hr tablet Take 2 tablets by mouth daily. 04/07/15   [provider]  Calcium-Magnesium-Vitamin D (CALCIUM 500 PO) Take 1 tablet by mouth daily.    [provider]  Cholecalciferol (VITAMIN D3) 2000 UNITS TABS Take 1 tablet by mouth daily.    [provider]  diltiazem (CARDIZEM CD) 360 MG 24 hr capsule  01/02/14   [provider]  docusate sodium (COLACE) 100 MG  capsule Take 100 mg by mouth 2 (two) times daily.    [provider]  Glucosamine-Chondroit-Vit C-Mn (GLUCOSAMINE CHONDR 1500 COMPLX) CAPS Take 2 capsules by mouth daily.    [provider]  levothyroxine (SYNTHROID, LEVOTHROID) 50 MCG tablet  02/10/14   [provider]  Multiple Vitamin (MULTIVITAMIN) tablet Take 1 tablet by mouth daily.    [provider]  Omega-3 Fatty Acids (FISH OIL) 1000 MG CAPS Take 4 capsules by mouth daily.    [provider]  PARoxetine (PAXIL) 10 MG tablet Take 10 mg by mouth every morning.    [provider]  polyethylene glycol (MIRALAX / GLYCOLAX) packet Take 17 g by mouth daily.    [provider]  traZODone (DESYREL) 150 MG tablet Take 0.5 tablets by mouth daily. 03/26/15   [provider]  triamterene-hydrochlorothiazide (MAXZIDE-25) 37.5-25 MG tablet Take 1 tablet  by mouth daily. 01/14/15   [provider]    Allergies    Banana  Review of Systems   Review of Systems  Gastrointestinal: Positive for constipation.  All other systems reviewed and are negative.   Physical Exam Updated Vital Signs BP (!) 156/75   Pulse (!) 56   Temp 97.9 F (36.6 C) (Oral)   Resp 17   Ht 5' 4.25" (1.632 m)   Wt 63.7 kg   LMP 11/08/2001 (Approximate)   SpO2 97%   BMI 23.92 kg/m   Physical Exam Vitals and nursing note reviewed.  Constitutional:      Appearance: She is well-developed.  HENT:     Head: Normocephalic and atraumatic.  Cardiovascular:     Rate and Rhythm: Normal rate and regular rhythm.     Heart sounds: No murmur heard.   Pulmonary:     Effort: Pulmonary effort is normal. No respiratory distress.     Breath sounds: Normal breath sounds.  Abdominal:     Palpations: Abdomen is soft.     Tenderness: There is no abdominal tenderness. There is no guarding or rebound.  Genitourinary:    Comments: No external hemorrhoids. There is a moderate amount of formed stool in the rectal vault. Musculoskeletal:        General: No tenderness.  Skin:    General: Skin is warm and dry.  Neurological:     Mental Status: She is alert and oriented to person, place, and time.  Psychiatric:        Behavior: Behavior normal.     ED Results / Procedures / Treatments   Labs (all labs ordered are listed, but only abnormal results are displayed) Labs Reviewed  COMPREHENSIVE METABOLIC PANEL - Abnormal; Notable for the following components:      Result Value   Sodium 132 (*)    Potassium 2.7 (*)    Chloride 97 (*)    Glucose, Bld 114 (*)    All other components within normal limits  CBC WITH DIFFERENTIAL/PLATELET - Abnormal; Notable for the following components:   WBC 13.8 (*)    Neutro Abs 10.9 (*)    Monocytes Absolute 1.2 (*)    All other components within normal limits  RESP PANEL BY RT-PCR (FLU A&B, COVID) ARPGX2  MAGNESIUM     EKG None  Radiology CT Abdomen Pelvis W Contrast  Result Date: 10/12/2020 CLINICAL DATA:  Abdominal distension. Constipation and rectal pain from hemorrhoids. EXAM: CT ABDOMEN AND PELVIS WITH CONTRAST TECHNIQUE: Multidetector CT imaging of the abdomen and pelvis was performed using the standard protocol following  bolus administration of intravenous contrast. CONTRAST:  177m OMNIPAQUE IOHEXOL 300 MG/ML  SOLN COMPARISON:  None. FINDINGS: Lower chest: Bilateral lower lobe subsegmental atelectasis. Hepatobiliary: Subcentimeter hypodensities are too small to characterize. No focal liver abnormality. No gallstones, gallbladder wall thickening, or pericholecystic fluid. No biliary dilatation. Pancreas: No focal lesion. Normal pancreatic contour. No surrounding inflammatory changes. The main pancreatic duct measures at the upper limits of normal. Suggestion of several hypodense outpouching along the main pancreatic duct that are poorly visualized (2:18). Spleen: Normal in size without focal abnormality. Adrenals/Urinary Tract: No adrenal nodule bilaterally. Bilateral kidneys enhance symmetrically. Subcentimeter hypodensities are too small to characterize. No hydronephrosis. No hydroureter. The urinary bladder is unremarkable. Stomach/Bowel: Stomach is within normal limits. No evidence of bowel wall thickening or dilatation. Stool throughout the colon. 5 cm stool ball within the rectum with associated rectal wall thickening and marked perirectal fat stranding. No definite pneumatosis. Appendix appears normal. Vascular/Lymphatic: No abdominal aorta or iliac aneurysm. Moderate severe calcified and noncalcified atherosclerotic plaque of the aorta and its branches. No abdominal, pelvic, or inguinal lymphadenopathy. Reproductive: Status post hysterectomy. No adnexal masses. Other: No intraperitoneal free gas. No organized fluid collection. Musculoskeletal: No abdominal wall hernia or abnormality. No suspicious lytic  or blastic osseous lesions. No acute displaced fracture. IMPRESSION: 1. Constipation with rectal wall thickening and marked perirectal fat stranding. Findings concerning for stercoral colitis. Underlying malignancy is not excluded. 2. Main pancreatic duct measures at the upper limits of normal with suggestion of a couple of hypodense outpouchings that could represent side-branch intraductal papillary mucinous neoplasm. Recommend MRI pancreatic protocol further evaluation Electronically Signed   By: MIven FinnM.D.   On: 10/12/2020 22:48    Procedures Fecal disimpaction  Date/Time: 10/13/2020 12:35 AM Performed by: RQuintella Reichert MD Authorized by: RQuintella Reichert MD  Consent: Verbal consent obtained. Patient identity confirmed: verbally with patient Local anesthesia used: yes  Anesthesia: Local anesthesia used: yes Local Anesthetic: topical anesthetic  Sedation: Patient sedated: no       Medications Ordered in ED Medications  sorbitol, milk of mag, mineral oil, glycerin (SMOG) enema (1,000 mLs Rectal Not Given 10/12/20 1950)  potassium chloride 10 mEq in 100 mL IVPB (10 mEq Intravenous New Bag/Given 10/13/20 0015)  piperacillin-tazobactam (ZOSYN) IVPB 3.375 g (3.375 g Intravenous New Bag/Given 10/13/20 0021)  lidocaine (XYLOCAINE) 2 % jelly 1 application (1 application Topical Given 10/12/20 1630)  sodium chloride 0.9 % bolus 500 mL (0 mLs Intravenous Stopped 10/12/20 2227)  LORazepam (ATIVAN) injection 0.5 mg (0.5 mg Intravenous Given 10/12/20 2044)  iohexol (OMNIPAQUE) 300 MG/ML solution 100 mL (100 mLs Intravenous Contrast Given 10/12/20 2221)    ED Course  I have reviewed the triage vital signs and the nursing notes.  Pertinent labs & imaging results that were available during my care of the patient were reviewed by me and considered in my medical decision making (see chart for details).    MDM Rules/Calculators/A&P                         patient with history of chronic  constipation here for evaluation of sensation of rectal fullness in the setting of missing her home laxatives. On examination she does have stool in the rectal vault. She was partially this impacted in the emergency department. During ED stay patient with ongoing sensation of severe fullness and discomfort in the rectal vault, unable to fully does impact. A CT abdomen pelvis was obtained, which is changes  of stercoral colitis. Labs also significant for hypokalemia and mild leukocytosis. Given patient's symptoms and lab findings will treat with antibiotics and admit for ongoing management. Hospitalist consulted for admission.  *Final Clinical Impression(s) / ED Diagnoses Final diagnoses:  Stercoral colitis  Hypokalemia    Rx / DC Orders ED Discharge Orders    None       Quintella Reichert, MD 10/13/20 [phone]

## 2020-10-12 NOTE — Telephone Encounter (Signed)
  Patient's husband called to report patient is "impacted" and has not had a BM in several days. Patient has hx of constipation and takes medication daily. Patient forgot medication when visiting grandchildren and now is having significant rectal pain . Denies feeling like she will pass out or vomit. Denies breathing difficulty. Severe pain in rectum. No bleeding reported at this time. Patient has attempted enema without relief. Instructed patient to try to apply lubricant as needed to rectum or warm soapy wash cloth to soften rectal area. Encouraged patient to go to ED if no call from PCP. PCP called patient back while on the phone with NT. Patient husband reported he would call back if necessary.    Reason for Disposition . [1] Rectal pain or fullness from fecal impaction (rectum full of stool) AND [2] NOT better after SITZ bath, suppository or enema  Answer Assessment - Initial Assessment Questions 1. STOOL PATTERN OR FREQUENCY: "How often do you pass bowel movements (BMs)?"  (Normal range: tid to q 3 days)  "When was the last BM passed?"       Hx constipation. Treated with medications daily  2. STRAINING: "Do you have to strain to have a BM?"      Yes  3. RECTAL PAIN: "Does your rectum hurt when the stool comes out?" If Yes, ask: "Do you have hemorrhoids? How bad is the pain?"  (Scale 1-10; or mild, moderate, severe)     severe 4. STOOL COMPOSITION: "Are the stools hard?"      Na  5. BLOOD ON STOOLS: "Has there been any blood on the toilet tissue or on the surface of the BM?" If Yes, ask: "When was the last time?"      Yes  6. CHRONIC CONSTIPATION: "Is this a new problem for you?"  If no, ask: "How long have you had this problem?" (days, weeks, months)      No . Constipated for years 7. CHANGES IN DIET OR HYDRATION: "Have there been any recent changes in your diet?" "How much fluids are you drinking consuming on a daily basis?"  "How much have you had to drink today?"     Change by not taking  medication due to forgetting to take meds on trip 8. MEDICATIONS: "Have you been taking any new medications?" "Are you taking any narcotic pain medications?" (e.g., Vicoden, Percocet, morphine, dilaudid)     na 9. LAXATIVES: "Have you been using any stool softeners, laxatives, or enemas?"  If yes, ask "What, how often, and when was the last time?" 10.ACTIVITY:  "How much walking do you do every day? on a daily basis?"  "Has your activity level decreased in the past week?"        na 11. CAUSE: "What do you think is causing the constipation?"        Forgot medications while on trip  12. OTHER SYMPTOMS: "Do you have any other symptoms?" (e.g., abdominal pain, bloating, fever, vomiting)       Denies abdominal pain , vomiting  13. MEDICAL HISTORY: "Do you have a history of hemorrhoids, rectal fissures, or rectal surgery or rectal abscess?"         Hx hemorrhoids 14. PREGNANCY: "Is there any chance you are pregnant?" "When was your last menstrual period?"       na  Protocols used: CONSTIPATION-A-AH

## 2020-10-12 NOTE — ED Notes (Signed)
Cleaned Pt. With soap and water; changed pt. Bedding.  Pt. Going to CT scanner then to have K+ started.

## 2020-10-12 NOTE — ED Triage Notes (Signed)
Constipation since yesterday am. States she takes Miralax daily but was out of town and was not able to take it for 4 days. States she is having rectal pain from her hemorrhoids.

## 2020-10-12 NOTE — ED Notes (Signed)
Pt. Lying on stretcher when RN in to see Pt.   Pt. Reports that she did an enema before coming to ED.  Pt. Has BSC at bedside.  Pt. States she has used the [facility] and she also soiled her pants.  RN gave the Pt. A bag to place her soiled clothing in.  ED into room to evaluate the Pt.

## 2020-10-12 NOTE — ED Notes (Signed)
Pt. Reports she feel just awful.  Pt. Made aware that she is not to get out of bed and is to stay lying down and call with call bell if need to be up.  Pt. Said she feels shaky all over.  RN retook vital signs and all stable.

## 2020-10-13 ENCOUNTER — Encounter (HOSPITAL_COMMUNITY): Payer: Self-pay | Admitting: Family Medicine

## 2020-10-13 DIAGNOSIS — K6289 Other specified diseases of anus and rectum: Secondary | ICD-10-CM | POA: Diagnosis not present

## 2020-10-13 DIAGNOSIS — K5289 Other specified noninfective gastroenteritis and colitis: Secondary | ICD-10-CM

## 2020-10-13 DIAGNOSIS — K529 Noninfective gastroenteritis and colitis, unspecified: Secondary | ICD-10-CM | POA: Diagnosis not present

## 2020-10-13 DIAGNOSIS — I10 Essential (primary) hypertension: Secondary | ICD-10-CM | POA: Diagnosis not present

## 2020-10-13 DIAGNOSIS — R933 Abnormal findings on diagnostic imaging of other parts of digestive tract: Secondary | ICD-10-CM | POA: Diagnosis not present

## 2020-10-13 DIAGNOSIS — Z20822 Contact with and (suspected) exposure to covid-19: Secondary | ICD-10-CM | POA: Diagnosis not present

## 2020-10-13 DIAGNOSIS — E876 Hypokalemia: Secondary | ICD-10-CM | POA: Diagnosis not present

## 2020-10-13 DIAGNOSIS — K5900 Constipation, unspecified: Secondary | ICD-10-CM | POA: Diagnosis not present

## 2020-10-13 DIAGNOSIS — E039 Hypothyroidism, unspecified: Secondary | ICD-10-CM | POA: Diagnosis not present

## 2020-10-13 DIAGNOSIS — Z79899 Other long term (current) drug therapy: Secondary | ICD-10-CM | POA: Diagnosis not present

## 2020-10-13 DIAGNOSIS — K5641 Fecal impaction: Secondary | ICD-10-CM | POA: Diagnosis not present

## 2020-10-13 LAB — BASIC METABOLIC PANEL
Anion gap: 8 (ref 5–15)
Anion gap: 6 (ref 5–15)
BUN: 8 mg/dL (ref 8–23)
BUN: 7 mg/dL — ABNORMAL LOW (ref 8–23)
CO2: 24 mmol/L (ref 22–32)
CO2: 23 mmol/L (ref 22–32)
Calcium: 8.1 mg/dL — ABNORMAL LOW (ref 8.9–10.3)
Calcium: 8.2 mg/dL — ABNORMAL LOW (ref 8.9–10.3)
Chloride: 103 mmol/L (ref 98–111)
Chloride: 107 mmol/L (ref 98–111)
Creatinine, Ser: 0.84 mg/dL (ref 0.44–1.00)
Creatinine, Ser: 0.67 mg/dL (ref 0.44–1.00)
GFR, Estimated: >60 mL/min (ref >60)
GFR, Estimated: >60 mL/min (ref >60)
Glucose, Bld: 94 mg/dL (ref 70–99)
Glucose, Bld: 123 mg/dL — ABNORMAL HIGH (ref 70–99)
Potassium: 2.5 mmol/L — CL (ref 3.5–5.1)
Potassium: 2.9 mmol/L — ABNORMAL LOW (ref 3.5–5.1)
Sodium: 135 mmol/L (ref 135–145)
Sodium: 136 mmol/L (ref 135–145)

## 2020-10-13 LAB — RESP PANEL BY RT-PCR (FLU A&B, COVID) ARPGX2
Influenza A by PCR: NEGATIVE
Influenza B by PCR: NEGATIVE
SARS Coronavirus 2 by RT PCR: NEGATIVE

## 2020-10-13 MED ORDER — BUPROPION HCL ER (SR) 150 MG PO TB12
300.0000 mg | ORAL_TABLET | Freq: Every day | ORAL | Status: DC
  Administered 2020-10-13 – 2020-10-14 (×2): 300 mg via ORAL
  Filled 2020-10-13 (×2): qty 2

## 2020-10-13 MED ORDER — LORAZEPAM 2 MG/ML IJ SOLN
2.0000 mg | Freq: Once | INTRAMUSCULAR | Status: AC
  Administered 2020-10-13: 2 mg via INTRAVENOUS
  Filled 2020-10-13: qty 1

## 2020-10-13 MED ORDER — ALPRAZOLAM 0.25 MG PO TABS: 0.2500 mg | ORAL_TABLET | Freq: Every evening | ORAL | Status: DC | PRN

## 2020-10-13 MED ORDER — TRAZODONE HCL 150 MG PO TABS
75.0000 mg | ORAL_TABLET | Freq: Every day | ORAL | Status: DC
  Administered 2020-10-13: 75 mg via ORAL
  Filled 2020-10-13: qty 1

## 2020-10-13 MED ORDER — DOCUSATE SODIUM 100 MG PO CAPS
100.0000 mg | ORAL_CAPSULE | Freq: Two times a day (BID) | ORAL | Status: DC
  Administered 2020-10-13 – 2020-10-14 (×3): 100 mg via ORAL
  Filled 2020-10-13 (×3): qty 1

## 2020-10-13 MED ORDER — POTASSIUM CHLORIDE CRYS ER 20 MEQ PO TBCR
40.0000 meq | EXTENDED_RELEASE_TABLET | Freq: Once | ORAL | Status: AC
  Administered 2020-10-13: 40 meq via ORAL
  Filled 2020-10-13: qty 2

## 2020-10-13 MED ORDER — GLUCOSAMINE CHONDR 1500 COMPLX PO CAPS: 2.0000 | ORAL_CAPSULE | Freq: Every day | ORAL | Status: DC

## 2020-10-13 MED ORDER — LIDOCAINE HCL (CARDIAC) PF 100 MG/5ML IV SOSY
PREFILLED_SYRINGE | INTRAVENOUS | Status: AC
  Filled 2020-10-13: qty 5

## 2020-10-13 MED ORDER — POTASSIUM CHLORIDE CRYS ER 20 MEQ PO TBCR: 40.0000 meq | EXTENDED_RELEASE_TABLET | Freq: Two times a day (BID) | ORAL | 0 refills | Status: DC

## 2020-10-13 MED ORDER — PEG 3350-KCL-NA BICARB-NACL 420 G PO SOLR: 4000.0000 mL | Freq: Once | ORAL | Status: DC

## 2020-10-13 MED ORDER — LIDOCAINE HCL (PF) 1 % IJ SOLN
INTRAMUSCULAR | Status: AC
  Filled 2020-10-13: qty 30

## 2020-10-13 MED ORDER — ADULT MULTIVITAMIN W/MINERALS CH
1.0000 | ORAL_TABLET | Freq: Every day | ORAL | Status: DC
  Administered 2020-10-13 – 2020-10-14 (×2): 1 via ORAL
  Filled 2020-10-13 (×2): qty 1

## 2020-10-13 MED ORDER — POTASSIUM CHLORIDE 10 MEQ/100ML IV SOLN
10.0000 meq | INTRAVENOUS | Status: AC
  Administered 2020-10-13 (×2): 10 meq via INTRAVENOUS
  Filled 2020-10-13 (×2): qty 100

## 2020-10-13 MED ORDER — VITAMIN D 25 MCG (1000 UNIT) PO TABS
2000.0000 [IU] | ORAL_TABLET | Freq: Every day | ORAL | Status: DC
  Administered 2020-10-13 – 2020-10-14 (×2): 2000 [IU] via ORAL
  Filled 2020-10-13 (×2): qty 2

## 2020-10-13 MED ORDER — POLYETHYLENE GLYCOL 3350 17 G PO PACK
17.0000 g | PACK | Freq: Every day | ORAL | Status: DC
  Administered 2020-10-13 – 2020-10-14 (×2): 17 g via ORAL
  Filled 2020-10-13 (×2): qty 1

## 2020-10-13 MED ORDER — ENOXAPARIN SODIUM 40 MG/0.4ML ~~LOC~~ SOLN
40.0000 mg | Freq: Every day | SUBCUTANEOUS | Status: DC
  Administered 2020-10-13 – 2020-10-14 (×2): 40 mg via SUBCUTANEOUS
  Filled 2020-10-13 (×2): qty 0.4

## 2020-10-13 MED ORDER — FLEET ENEMA 7-19 GM/118ML RE ENEM
1.0000 | ENEMA | Freq: Once | RECTAL | Status: AC
  Administered 2020-10-13: 1 via RECTAL
  Filled 2020-10-13: qty 1

## 2020-10-13 MED ORDER — SORBITOL 70 % SOLN
30.0000 mL | Freq: Every day | Status: DC
  Administered 2020-10-13 – 2020-10-14 (×2): 30 mL via ORAL
  Filled 2020-10-13 (×2): qty 30

## 2020-10-13 MED ORDER — LIDOCAINE VISCOUS HCL 2 % MT SOLN
OROMUCOSAL | Status: AC
  Filled 2020-10-13: qty 15

## 2020-10-13 MED ORDER — SODIUM CHLORIDE 0.9 % IV SOLN: INTRAVENOUS | Status: DC

## 2020-10-13 MED ORDER — SORBITOL 70 % SOLN: 30.0000 mL | Freq: Every day | 0 refills | Status: DC

## 2020-10-13 MED ORDER — DILTIAZEM HCL ER COATED BEADS 180 MG PO CP24
300.0000 mg | ORAL_CAPSULE | Freq: Every day | ORAL | Status: DC
  Administered 2020-10-13 – 2020-10-14 (×2): 300 mg via ORAL
  Filled 2020-10-13 (×2): qty 1

## 2020-10-13 MED ORDER — LEVOTHYROXINE SODIUM 50 MCG PO TABS
50.0000 ug | ORAL_TABLET | Freq: Every day | ORAL | Status: DC
  Administered 2020-10-13 – 2020-10-14 (×2): 50 ug via ORAL
  Filled 2020-10-13 (×2): qty 1

## 2020-10-13 MED ORDER — MAGNESIUM CITRATE PO SOLN: 1.0000 | Freq: Once | ORAL | Status: DC | PRN

## 2020-10-13 MED ORDER — PAROXETINE HCL 10 MG PO TABS
10.0000 mg | ORAL_TABLET | Freq: Every day | ORAL | Status: DC
  Administered 2020-10-13 – 2020-10-14 (×2): 10 mg via ORAL
  Filled 2020-10-13 (×2): qty 1

## 2020-10-13 NOTE — Consult Note (Signed)
Reason for Consult: Fecal impaction Referring Physician: [facility]  Cynthia Alexander HPI: This is a 65 year old female with a PMH of constipation, HTN, and osteopenia admitted for complaints of proctalgia and constipation.  On a routine basis she uses Miralax, but she was off her routine recently when she was with family.  As a result she started to experience rectal pressure, which was progressively painful.  In the ER a CT scan showed fecal impaction in the rectum measuring 5 cm.  An enema was provided for her yesterday and it was not beneficial.  An attempted disimpaction was performed, but it was reported that the stool ball migrated upwards.  From that attempted disimpaction this AM she reported having a bowel movement.  Currently she is feeling better and the pressure sensation is not present.  Past Medical History:  Diagnosis Date  . BRCA negative 01/2010   BRCA I/ II negative, done secondary to mother's history of Breast Cancer at age 28  . Celiac sprue 3/07  . History of depression   . Hypertension   . Osteopenia   . Thyroid disease    hypothyroid    Past Surgical History:  Procedure Laterality Date  . ABDOMINAL HYSTERECTOMY    . CESAREAN SECTION  1987  . COLONOSCOPY  10/04/05   celiac  . ESOPHAGOGASTRODUODENOSCOPY ENDOSCOPY  10/04/05   Biopsy shows celiac  . FEMORAL HERNIA REPAIR Right 07/2004  . VAGINAL DELIVERY     x4  . VAGINAL HYSTERECTOMY  11/27/2001   secondary to adenomyosis, ovaries remain    Family History  Problem Relation Age of Onset  . Breast cancer Mother 30       second time postmenopausal  . Prostate cancer Father   . Chronic Renal Failure Father   . Hypertension Brother   . Breast cancer Maternal Aunt        postmenopausal    Social History:  reports that she has never smoked. She has never used smokeless tobacco. She reports that she does not drink alcohol and does not use drugs.  Allergies:  Allergies  Allergen Reactions  . Banana  Diarrhea    Severe stomach cramps     Medications:  Scheduled: . lidocaine (PF)      . buPROPion  300 mg Oral Daily  . cholecalciferol  2,000 Units Oral Daily  . diltiazem  300 mg Oral Daily  . docusate sodium  100 mg Oral BID  . enoxaparin (LOVENOX) injection  40 mg Subcutaneous Daily  . levothyroxine  50 mcg Oral Q0600  . LORazepam  2 mg Intravenous Once  . multivitamin with minerals  1 tablet Oral Daily  . PARoxetine  10 mg Oral Daily  . polyethylene glycol  17 g Oral Daily  . sorbitol  30 mL Oral Daily  . sorbitol, milk of mag, mineral oil, glycerin (SMOG) enema  960 mL Rectal Once  . traZODone  75 mg Oral QHS   Continuous: . sodium chloride 50 mL/hr at 10/13/20 1002    Results for orders placed or performed during the hospital encounter of 10/12/20 (from the past 24 hour(s))  Comprehensive metabolic panel     Status: Abnormal   Collection Time: 10/12/20  8:48 PM  Result Value Ref Range   Sodium 132 (L) 135 - 145 mmol/L   Potassium 2.7 (LL) 3.5 - 5.1 mmol/L   Chloride 97 (L) 98 - 111 mmol/L   CO2 24 22 - 32 mmol/L   Glucose, Bld 114 (  H) 70 - 99 mg/dL   BUN 14 8 - 23 mg/dL   Creatinine, Ser 0.80 0.44 - 1.00 mg/dL   Calcium 8.9 8.9 - 10.3 mg/dL   Total Protein 7.1 6.5 - 8.1 g/dL   Albumin 4.0 3.5 - 5.0 g/dL   AST 21 15 - 41 U/L   ALT 17 0 - 44 U/L   Alkaline Phosphatase 59 38 - 126 U/L   Total Bilirubin 0.5 0.3 - 1.2 mg/dL   GFR, Estimated >60 >60 mL/min   Anion gap 11 5 - 15  CBC with Differential     Status: Abnormal   Collection Time: 10/12/20  8:48 PM  Result Value Ref Range   WBC 13.8 (H) 4.0 - 10.5 K/uL   RBC 4.07 3.87 - 5.11 MIL/uL   Hemoglobin 13.0 12.0 - 15.0 g/dL   HCT 37.1 36.0 - 46.0 %   MCV 91.2 80.0 - 100.0 fL   MCH 31.9 26.0 - 34.0 pg   MCHC 35.0 30.0 - 36.0 g/dL   RDW 13.4 11.5 - 15.5 %   Platelets 285 150 - 400 K/uL   nRBC 0.0 0.0 - 0.2 %   Neutrophils Relative % 79 %   Neutro Abs 10.9 (H) 1.7 - 7.7 K/uL   Lymphocytes Relative 10 %    Lymphs Abs 1.4 0.7 - 4.0 K/uL   Monocytes Relative 9 %   Monocytes Absolute 1.2 (H) 0.1 - 1.0 K/uL   Eosinophils Relative 1 %   Eosinophils Absolute 0.1 0.0 - 0.5 K/uL   Basophils Relative 1 %   Basophils Absolute 0.1 0.0 - 0.1 K/uL   Immature Granulocytes 0 %   Abs Immature Granulocytes 0.04 0.00 - 0.07 K/uL  Magnesium     Status: None   Collection Time: 10/12/20  8:48 PM  Result Value Ref Range   Magnesium 2.0 1.7 - 2.4 mg/dL  Resp Panel by RT-PCR (Flu A&B, Covid) Nasopharyngeal Swab     Status: None   Collection Time: 10/13/20 12:34 AM   Specimen: Nasopharyngeal Swab; Nasopharyngeal(NP) swabs in vial transport medium  Result Value Ref Range   SARS Coronavirus 2 by RT PCR NEGATIVE NEGATIVE   Influenza A by PCR NEGATIVE NEGATIVE   Influenza B by PCR NEGATIVE NEGATIVE  Basic metabolic panel     Status: Abnormal   Collection Time: 10/13/20  5:32 AM  Result Value Ref Range   Sodium 135 135 - 145 mmol/L   Potassium 2.5 (LL) 3.5 - 5.1 mmol/L   Chloride 103 98 - 111 mmol/L   CO2 24 22 - 32 mmol/L   Glucose, Bld 94 70 - 99 mg/dL   BUN 8 8 - 23 mg/dL   Creatinine, Ser 0.84 0.44 - 1.00 mg/dL   Calcium 8.1 (L) 8.9 - 10.3 mg/dL   GFR, Estimated >60 >60 mL/min   Anion gap 8 5 - 15  Basic metabolic panel     Status: Abnormal   Collection Time: 10/13/20  2:07 PM  Result Value Ref Range   Sodium 136 135 - 145 mmol/L   Potassium 2.9 (L) 3.5 - 5.1 mmol/L   Chloride 107 98 - 111 mmol/L   CO2 23 22 - 32 mmol/L   Glucose, Bld 123 (H) 70 - 99 mg/dL   BUN 7 (L) 8 - 23 mg/dL   Creatinine, Ser 0.67 0.44 - 1.00 mg/dL   Calcium 8.2 (L) 8.9 - 10.3 mg/dL   GFR, Estimated >60 >60 mL/min   Anion gap 6  5 - 15     CT Abdomen Pelvis W Contrast  Result Date: 10/12/2020 CLINICAL DATA:  Abdominal distension. Constipation and rectal pain from hemorrhoids. EXAM: CT ABDOMEN AND PELVIS WITH CONTRAST TECHNIQUE: Multidetector CT imaging of the abdomen and pelvis was performed using the standard protocol  following bolus administration of intravenous contrast. CONTRAST:  136m OMNIPAQUE IOHEXOL 300 MG/ML  SOLN COMPARISON:  None. FINDINGS: Lower chest: Bilateral lower lobe subsegmental atelectasis. Hepatobiliary: Subcentimeter hypodensities are too small to characterize. No focal liver abnormality. No gallstones, gallbladder wall thickening, or pericholecystic fluid. No biliary dilatation. Pancreas: No focal lesion. Normal pancreatic contour. No surrounding inflammatory changes. The main pancreatic duct measures at the upper limits of normal. Suggestion of several hypodense outpouching along the main pancreatic duct that are poorly visualized (2:18). Spleen: Normal in size without focal abnormality. Adrenals/Urinary Tract: No adrenal nodule bilaterally. Bilateral kidneys enhance symmetrically. Subcentimeter hypodensities are too small to characterize. No hydronephrosis. No hydroureter. The urinary bladder is unremarkable. Stomach/Bowel: Stomach is within normal limits. No evidence of bowel wall thickening or dilatation. Stool throughout the colon. 5 cm stool ball within the rectum with associated rectal wall thickening and marked perirectal fat stranding. No definite pneumatosis. Appendix appears normal. Vascular/Lymphatic: No abdominal aorta or iliac aneurysm. Moderate severe calcified and noncalcified atherosclerotic plaque of the aorta and its branches. No abdominal, pelvic, or inguinal lymphadenopathy. Reproductive: Status post hysterectomy. No adnexal masses. Other: No intraperitoneal free gas. No organized fluid collection. Musculoskeletal: No abdominal wall hernia or abnormality. No suspicious lytic or blastic osseous lesions. No acute displaced fracture. IMPRESSION: 1. Constipation with rectal wall thickening and marked perirectal fat stranding. Findings concerning for stercoral colitis. Underlying malignancy is not excluded. 2. Main pancreatic duct measures at the upper limits of normal with suggestion of a  couple of hypodense outpouchings that could represent side-branch intraductal papillary mucinous neoplasm. Recommend MRI pancreatic protocol further evaluation Electronically Signed   By: MIven FinnM.D.   On: 10/12/2020 22:48    ROS:  As stated above in the HPI otherwise negative.  Blood pressure 138/75, pulse 62, temperature 98.3 F (36.8 C), temperature source Oral, resp. rate 16, height 5' 4.25" (1.632 m), weight 63.7 kg, last menstrual period 11/08/2001, SpO2 97 %.    PE: Gen: NAD, Alert and Oriented HEENT:  [place]/AT, EOMI Neck: Supple, no LAD Lungs: CTA Bilaterally CV: RRR without M/G/R ABD: Soft, NTND, +BS Ext: No C/C/E  Assessment/Plan: 1) Fecal impaction. 2) Proctalgia. 3) Hypokalemia.   A rectal examination was performed with the plan to disimpact the patient.  No stool ball was palpated.  She had soft liquid stool in the rectum.  It may be that she passed the stool or the stool ball migrated proximally.  It is difficult to discern, but clinically she is feeling better.  Plan: 1) Trial of a Fleets enema. 2) Correct hypokalemia.  Cynthia Alexander D 10/13/2020, 3:57 PM

## 2020-10-13 NOTE — Progress Notes (Signed)
Patient arrived to 6N05, alert and oriented x 4, able to make needs known. No DOB/SOB noted. No acute distress 97% on room air. Denies any pain at this moment. Oriented to call bell and surroundings. Will continue to close monitor.  Messaged [facility] Admits & Consults about patient's arrival.

## 2020-10-13 NOTE — Progress Notes (Addendum)
Date and time results received: 10/13/20 0637  Test: Potassium Critical Value: 2.5  Name of Provider Notified: O. Josephine Cables, MD at [place], MD at [phone]  Orders Received: Potassium 30meq/100ml IVPB x 2                               Potassium 18meq tab x 1 dose

## 2020-10-13 NOTE — Progress Notes (Signed)
Patient passing stool discussed with Dr. Benson Norway we will keep overnight and consider discharge tomorrow if potassium is improved and if she does not have any further pain and or abdominal discomfort as we are not sure if fecal ball moved more proximally or not We will consider discharge tomorrow if all stable.  Discussed with the patient's husband.  Cynthia Griffes, MD [facility] 4:49 PM

## 2020-10-13 NOTE — ED Notes (Signed)
Report called to [place] to [employer].

## 2020-10-13 NOTE — Discharge Summary (Signed)
Physician Discharge Summary  Cynthia Alexander YBO:[id number] DOB: [birth year] DOA: 10/12/2020  PCP: [name], MD  Admit date: 10/12/2020 Discharge date: 10/13/2020  Time spent: 27 minutes  Recommendations for Outpatient Follow-up:  New meds sorbitol potassium Holding thiazide diuretic calcium vitamin D for now Requires Chem-12 in several days at PCP office Recommend outpatient colonoscopy  Discharge Diagnoses:  MAIN problem for hospitalization   Obstipation constipation and stercoral colitis  Please see below for itemized issues addressed in [place]- refer to other progress notes for clarity if needed  Discharge Condition: Improved  Diet recommendation: Soft  Filed Weights   10/12/20 1251  Weight: 63.7 kg    History of present illness:  31 white 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  Celiac sprue depression osteopenia hypothyroid, benign breast lump 05/30/2020 Appears to have had visit at OB/GYN 09/10/2020-  Called PCP office-had forgotten to take various laxatives over the past several days-resultant rectalgia-trial of enema at the instruction of triage nurse--no rsult worsening pain-was told to go to the emergency room   In the emergency room reported to ED severe rectal pressure sensation of fullness ability to only pass small amount of stool Found by emergency room physician to be partially impacted given smog enema and could not disimpact-CT abdomen pelvis showed stercoral colitis with labs showing white count of 13, potassium 2.5 rest of chemistries etc. negative  Gastroenterologist Dr. Benson Norway was consulted but prior to him coming patient had a bottle of mag citrate and passed a stool He made recommendations for the patient to have an enema to ensure that still having stools Her potassium was corrected aggressively and resolved on discharge She was instructed to discontinue her thiazide diuretic which can cause potassium wasting She was  instructed to discontinue temporarily her calcium She can follow-up with [name] in the outpatient for routine medical care inclusive of consideration for DEXA scanning, hormone replacement therapy given her osteopenia in addition to routine mammograms etc. etc. There was a concern for potential  Discharge Exam: Vitals:   10/13/20 0511 10/13/20 1307  BP: (!) 141/69 138/75  Pulse: (!) 54 62  Resp: 16   Temp: 98.4 F (36.9 C) 98.3 F (36.8 C)  SpO2: 98% 97%    Subj on day of d/c   Awake alert coherent no distress EOMI NCAT no focal deficit  General Exam on discharge  EOMI NCAT S1-S2 no murmur no rub no gallop Chest clinically clear no added sound no rales no rhonchi  Discharge Instructions   Discharge Instructions     Diet - low sodium heart healthy   Complete by: As directed    Discharge instructions   Complete by: As directed    Take sorbitol for the next 2 to 3 days to ensure you have regular loose bowel motions You will need to take potassium twice a day until he can get labs in 3 to 4 days or at the end of the week at [name] office Some your blood pressure medications can drop your potassium so I have stopped temporarily your Maxide-calcium can constipate you as can vitamin D C do not take them until you follow-up with [name] I would suggest that you also follow-up with [name] in the outpatient setting for routine follow-up and colonoscopy care   Increase activity slowly   Complete by: As directed       Allergies as of 10/13/2020       Reactions   Banana Diarrhea  Severe stomach cramps         Medication List     STOP taking these medications    CALCIUM 500 PO   triamterene-hydrochlorothiazide 37.5-25 MG tablet Commonly known as: MAXZIDE-25   Vitamin D3 50 MCG (2000 UT) Tabs       TAKE these medications    ALPRAZolam 0.25 MG tablet Commonly known as: XANAX Take 0.25 mg by mouth at bedtime as needed for sleep.   buPROPion 150 MG 12  hr tablet Commonly known as: WELLBUTRIN SR Take 150 mg by mouth daily.   diltiazem 360 MG 24 hr capsule Commonly known as: CARDIZEM CD Take 360 mg by mouth daily.   docusate sodium 100 MG capsule Commonly known as: COLACE Take 100 mg by mouth 2 (two) times daily.   Fish Oil 1000 MG Caps Take 2,000 mg by mouth daily.   Glucosamine Chondr 1500 Complx Caps Take 2 capsules by mouth daily.   levothyroxine 50 MCG tablet Commonly known as: SYNTHROID Take 50 mcg by mouth daily before breakfast.   multivitamin tablet Take 1 tablet by mouth daily.   PARoxetine 10 MG tablet Commonly known as: PAXIL Take 10 mg by mouth every morning.   polyethylene glycol 17 g packet Commonly known as: MIRALAX / GLYCOLAX Take 17 g by mouth daily.   potassium chloride SA 20 MEQ tablet Commonly known as: KLOR-CON Take 2 tablets (40 mEq total) by mouth 2 (two) times daily.   sorbitol 70 % Soln Take 30 mLs by mouth daily.   traZODone 150 MG tablet Commonly known as: DESYREL Take 75 mg by mouth at bedtime.       Allergies  Allergen Reactions   Banana Diarrhea    Severe stomach cramps        The results of significant diagnostics from this hospitalization (including imaging, microbiology, ancillary and laboratory) are listed below for reference.    Significant Diagnostic Studies: CT Abdomen Pelvis W Contrast  Result Date: 10/12/2020 CLINICAL DATA:  Abdominal distension. Constipation and rectal pain from hemorrhoids. EXAM: CT ABDOMEN AND PELVIS WITH CONTRAST TECHNIQUE: Multidetector CT imaging of the abdomen and pelvis was performed using the standard protocol following bolus administration of intravenous contrast. CONTRAST:  132mL OMNIPAQUE IOHEXOL 300 MG/ML  SOLN COMPARISON:  None. FINDINGS: Lower chest: Bilateral lower lobe subsegmental atelectasis. Hepatobiliary: Subcentimeter hypodensities are too small to characterize. No focal liver abnormality. No gallstones, gallbladder wall  thickening, or pericholecystic fluid. No biliary dilatation. Pancreas: No focal lesion. Normal pancreatic contour. No surrounding inflammatory changes. The main pancreatic duct measures at the upper limits of normal. Suggestion of several hypodense outpouching along the main pancreatic duct that are poorly visualized (2:18). Spleen: Normal in size without focal abnormality. Adrenals/Urinary Tract: No adrenal nodule bilaterally. Bilateral kidneys enhance symmetrically. Subcentimeter hypodensities are too small to characterize. No hydronephrosis. No hydroureter. The urinary bladder is unremarkable. Stomach/Bowel: Stomach is within normal limits. No evidence of bowel wall thickening or dilatation. Stool throughout the colon. 5 cm stool ball within the rectum with associated rectal wall thickening and marked perirectal fat stranding. No definite pneumatosis. Appendix appears normal. Vascular/Lymphatic: No abdominal aorta or iliac aneurysm. Moderate severe calcified and noncalcified atherosclerotic plaque of the aorta and its branches. No abdominal, pelvic, or inguinal lymphadenopathy. Reproductive: Status post hysterectomy. No adnexal masses. Other: No intraperitoneal free gas. No organized fluid collection. Musculoskeletal: No abdominal wall hernia or abnormality. No suspicious lytic or blastic osseous lesions. No acute displaced fracture. IMPRESSION: 1. Constipation with rectal wall  thickening and marked perirectal fat stranding. Findings concerning for stercoral colitis. Underlying malignancy is not excluded. 2. Main pancreatic duct measures at the upper limits of normal with suggestion of a couple of hypodense outpouchings that could represent side-branch intraductal papillary mucinous neoplasm. Recommend MRI pancreatic protocol further evaluation Electronically Signed   By: Iven Finn M.D.   On: 10/12/2020 22:48    Microbiology: Recent Results (from the past 240 hour(s))  Resp Panel by RT-PCR (Flu A&B,  Covid) Nasopharyngeal Swab     Status: None   Collection Time: 10/13/20 12:34 AM   Specimen: Nasopharyngeal Swab; Nasopharyngeal(NP) swabs in vial transport medium  Result Value Ref Range Status   SARS Coronavirus 2 by RT PCR NEGATIVE NEGATIVE Final    Comment: (NOTE) SARS-CoV-2 target nucleic acids are NOT DETECTED.  The SARS-CoV-2 RNA is generally detectable in upper respiratory specimens during the acute phase of infection. The lowest concentration of SARS-CoV-2 viral copies this assay can detect is 138 copies/mL. A negative result does not preclude SARS-Cov-2 infection and should not be used as the sole basis for treatment or other patient management decisions. A negative result may occur with  improper specimen collection/handling, submission of specimen other than nasopharyngeal swab, presence of viral mutation(s) within the areas targeted by this assay, and inadequate number of viral copies(<138 copies/mL). A negative result must be combined with clinical observations, patient history, and epidemiological information. The expected result is Negative.  Fact Sheet for Patients:  EntrepreneurPulse.com.au  Fact Sheet for Healthcare Providers:  IncredibleEmployment.be  This test is no t yet approved or cleared by the [place] FDA and  has been authorized for detection and/or diagnosis of SARS-CoV-2 by FDA under an Emergency Use Authorization (EUA). This EUA will remain  in effect (meaning this test can be used) for the duration of the COVID-19 declaration under Section 564(b)(1) of the Act, 21 U.S.C.section 360bbb-3(b)(1), unless the authorization is terminated  or revoked sooner.       Influenza A by PCR NEGATIVE NEGATIVE Final   Influenza B by PCR NEGATIVE NEGATIVE Final    Comment: (NOTE) The Xpert Xpress SARS-CoV-2/FLU/RSV plus assay is intended as an aid in the diagnosis of influenza from Nasopharyngeal swab specimens and should  not be used as a sole basis for treatment. Nasal washings and aspirates are unacceptable for Xpert Xpress SARS-CoV-2/FLU/RSV testing.  Fact Sheet for Patients: EntrepreneurPulse.com.au  Fact Sheet for Healthcare Providers: IncredibleEmployment.be  This test is not yet approved or cleared by the [place] FDA and has been authorized for detection and/or diagnosis of SARS-CoV-2 by FDA under an Emergency Use Authorization (EUA). This EUA will remain in effect (meaning this test can be used) for the duration of the COVID-19 declaration under Section 564(b)(1) of the Act, 21 U.S.C. section 360bbb-3(b)(1), unless the authorization is terminated or revoked.  Performed at [facility], [address] [place], [place] [address]      Labs: Basic Metabolic Panel: Recent Labs  Lab 10/12/20 2048 10/13/20 0532 10/13/20 1407  NA 132* 135 136  K 2.7* 2.5* 2.9*  CL 97* 103 107  CO2 24 24 23   GLUCOSE 114* 94 123*  BUN 14 8 7*  CREATININE 0.80 0.84 0.67  CALCIUM 8.9 8.1* 8.2*  MG 2.0  --   --    Liver Function Tests: Recent Labs  Lab 10/12/20 2048  AST 21  ALT 17  ALKPHOS 59  BILITOT 0.5  PROT 7.1  ALBUMIN 4.0   No  results for input(s): LIPASE, AMYLASE in the last 168 hours. No results for input(s): AMMONIA in the last 168 hours. CBC: Recent Labs  Lab 10/12/20 2048  WBC 13.8*  NEUTROABS 10.9*  HGB 13.0  HCT 37.1  MCV 91.2  PLT 285   Cardiac Enzymes: No results for input(s): CKTOTAL, CKMB, CKMBINDEX, TROPONINI in the last 168 hours. BNP: BNP (last 3 results) No results for input(s): BNP in the last 8760 hours.  ProBNP (last 3 results) No results for input(s): PROBNP in the last 8760 hours.  CBG: No results for input(s): GLUCAP in the last 168 hours.     Signed:  Nita Sells MD   [facility] 10/13/2020, 4:44 PM

## 2020-10-13 NOTE — Progress Notes (Addendum)
Paged [place],MD about patient's arrival. MD called back. current potassium level 2.7. new order received (BMP). currently on cardiac monitor.  Will continue to monitor.

## 2020-10-13 NOTE — H&P (Addendum)
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  Celiac sprue depression osteopenia hypothyroid, benign breast lump 05/30/2020 Appears to have had visit at OB/GYN 09/10/2020-  Called PCP office-had forgotten to take various laxatives over the past several days-resultant rectalgia-trial of enema at the instruction of triage nurse--no rsult worsening pain-was told to go to the emergency room  In the emergency room reported to ED severe rectal pressure sensation of fullness ability to only pass small amount of stool Found by emergency room physician to be partially impacted given smog enema and could not disimpact-CT abdomen pelvis showed stercoral colitis with labs showing white count of 13, potassium 2.5 rest of chemistries etc. negative  Review of Systems:    Pertinent +'s: Pain in abdomen/rectum Pertinent -"s: Dark stool tarry stool hematemesis blurred vision double vision unilateral weakness fall etc.  ED Course: As above   Past Medical History:  Diagnosis Date  . BRCA negative 01/2010   BRCA I/ II negative, done secondary to mother's history of Breast Cancer at age 44  . Celiac sprue 3/07  . History of depression   . Hypertension   . Osteopenia   . Thyroid disease    hypothyroid   Past Surgical History:  Procedure Laterality Date  . ABDOMINAL HYSTERECTOMY    . CESAREAN SECTION  1987  . COLONOSCOPY  10/04/05   celiac  . ESOPHAGOGASTRODUODENOSCOPY ENDOSCOPY  10/04/05   Biopsy shows celiac  . FEMORAL HERNIA REPAIR Right 07/2004  . VAGINAL DELIVERY     x4  . VAGINAL HYSTERECTOMY  11/27/2001   secondary to adenomyosis, ovaries remain    reports that she has never smoked. She has never used smokeless tobacco. She reports that she does not drink alcohol and does not use drugs.  Mobility: Independent  Allergies   Allergen Reactions  . Banana Diarrhea    Severe stomach cramps    Family History  Problem Relation Age of Onset  . Breast cancer Mother 60       second time postmenopausal  . Prostate cancer Father   . Chronic Renal Failure Father   . Hypertension Brother   . Breast cancer Maternal Aunt        postmenopausal   Prior to Admission medications   Medication Sig Start Date End Date Taking? Authorizing Provider  ALPRAZolam Duanne Moron) 0.25 MG tablet Take 0.25 mg by mouth at bedtime as needed for sleep.   Yes [provider]  buPROPion (WELLBUTRIN SR) 150 MG 12 hr tablet Take 150 mg by mouth daily. 04/07/15  Yes [provider]  Calcium-Magnesium-Vitamin D (CALCIUM 500 PO) Take 1 tablet by mouth in the morning and at bedtime.   Yes [provider]  Cholecalciferol (VITAMIN D3) 2000 UNITS TABS Take 2,000 Units by mouth every other day.   Yes [provider]  diltiazem (CARDIZEM CD) 360 MG 24 hr capsule Take 360 mg by mouth daily. 01/02/14  Yes [provider]  docusate sodium (COLACE) 100 MG capsule Take 100 mg by mouth 2 (two) times daily.   Yes [provider]  Glucosamine-Chondroit-Vit C-Mn (GLUCOSAMINE CHONDR 1500 COMPLX) CAPS Take 2 capsules by mouth daily.   Yes [provider]  levothyroxine (SYNTHROID, LEVOTHROID) 50 MCG tablet Take 50 mcg by mouth daily before breakfast. 02/10/14  Yes [provider]  Multiple Vitamin (  MULTIVITAMIN) tablet Take 1 tablet by mouth daily.   Yes [provider]  Omega-3 Fatty Acids (FISH OIL) 1000 MG CAPS Take 2,000 mg by mouth daily.   Yes [provider]  PARoxetine (PAXIL) 10 MG tablet Take 10 mg by mouth every morning.   Yes [provider]  polyethylene glycol (MIRALAX / GLYCOLAX) packet Take 17 g by mouth daily.   Yes [provider]  traZODone (DESYREL) 150 MG tablet Take 75 mg by mouth at bedtime. 03/26/15  Yes [provider]   triamterene-hydrochlorothiazide (MAXZIDE-25) 37.5-25 MG tablet Take 1 tablet by mouth daily. 01/14/15  Yes [provider]    Physical Exam:  Vitals:   10/13/20 0304 10/13/20 0511  BP:  (!) 141/69  Pulse:  (!) 54  Resp: 18 16  Temp:  98.4 F (36.9 C)  SpO2:  98%     Pleasant white lady looking younger than stated age  EOMI NCAT  Good dentition  Mallampati 1  No icterus no pallor  Abdomen soft slight tender and fullness in the lower quadrant  Rectal exam in presence of chaperone shows slight bulge at lower anal verge no hemorrhoid  CTA B no added sound  S1-S2 sinus  No lower extremity edema  I have personally reviewed following labs and imaging studies  Labs:   Potassium 2.5  BUNs/creatinine 14/0.8-->8/0.8  Imaging studies:   CT as above  Medical tests:   EKG independently reviewed: None performed on admission  Test discussed with performing physician:  He has discussed the case with Dr. Benson Norway  Decision to obtain old records:   Reviewed  Review and summation of old records:   Reviewed in detail-I have forwarded note to [name] her PCP  Active Problems:   Colitis   Assessment/Plan Rectalgia, impaction of stool 5 cm bowel Stercoral colitis 2 attempts at disimpaction-Dr. [facility] gastroenterology consulted and will attempt disimpaction Plan deferred to him in terms of regimen on discharge I am comfortable discontinuing Zosyn if Dr. Benson Norway agrees I do not see any sepsis physiology ?  Need further colonoscopy as outpatient Moderate to severe hypokalemia Replaced with both IV and p.o.-labs at 2 PM If normalizes would stop replacement Osteopenia, breast lump Outpatient PCP care coordination with regards to DEXA scan, routine mammograms, risk-benefit discussion hormone replacement therapy versus Prolia etc. etc.  Severity of Illness: The appropriate patient status for this patient is OBSERVATION. Observation status is judged to be reasonable  and necessary in order to provide the required intensity of service to ensure the patient's safety. The patient's presenting symptoms, physical exam findings, and initial radiographic and laboratory data in the context of their medical condition is felt to place them at decreased risk for further clinical deterioration. Furthermore, it is anticipated that the patient will be medically stable for discharge from the hospital within 2 midnights of admission. The following factors support the patient status of observation.   " The patient's presenting symptoms include severe abdominal pain. " The physical exam findings include slightly distended abdomen. " The initial radiographic and laboratory data are concerning for stool ball.     DVT prophylaxis: Lovenox Code Status: Full Family Communication: None present Consults called: GI Dr. Benson Norway  Time spent: 56 minutes  Verlon Au, MD [days-call my NP partners at night for Care related issues] [facility] --Via amion app OR , [email or web address]; password [facility]  10/13/2020, 11:44 AM

## 2020-10-14 DIAGNOSIS — K5289 Other specified noninfective gastroenteritis and colitis: Secondary | ICD-10-CM | POA: Diagnosis not present

## 2020-10-14 LAB — CBC
HCT: 32.6 % — ABNORMAL LOW (ref 36.0–46.0)
Hemoglobin: 11.4 g/dL — ABNORMAL LOW (ref 12.0–15.0)
MCH: 32.7 pg (ref 26.0–34.0)
MCHC: 35 g/dL (ref 30.0–36.0)
MCV: 93.4 fL (ref 80.0–100.0)
Platelets: 252 10*3/uL (ref 150–400)
RBC: 3.49 MIL/uL — ABNORMAL LOW (ref 3.87–5.11)
RDW: 13.5 % (ref 11.5–15.5)
WBC: 6.2 10*3/uL (ref 4.0–10.5)
nRBC: 0 % (ref 0.0–0.2)

## 2020-10-14 LAB — BASIC METABOLIC PANEL
Anion gap: 5 (ref 5–15)
BUN: 6 mg/dL — ABNORMAL LOW (ref 8–23)
CO2: 26 mmol/L (ref 22–32)
Calcium: 8.8 mg/dL — ABNORMAL LOW (ref 8.9–10.3)
Chloride: 106 mmol/L (ref 98–111)
Creatinine, Ser: 0.74 mg/dL (ref 0.44–1.00)
GFR, Estimated: >60 mL/min (ref >60)
Glucose, Bld: 153 mg/dL — ABNORMAL HIGH (ref 70–99)
Potassium: 3.3 mmol/L — ABNORMAL LOW (ref 3.5–5.1)
Sodium: 137 mmol/L (ref 135–145)

## 2020-10-14 LAB — COMPREHENSIVE METABOLIC PANEL
ALT: 14 U/L (ref 0–44)
AST: 17 U/L (ref 15–41)
Albumin: 3.2 g/dL — ABNORMAL LOW (ref 3.5–5.0)
Alkaline Phosphatase: 52 U/L (ref 38–126)
Anion gap: 7 (ref 5–15)
BUN: <5 mg/dL — ABNORMAL LOW (ref 8–23)
CO2: 24 mmol/L (ref 22–32)
Calcium: 8.4 mg/dL — ABNORMAL LOW (ref 8.9–10.3)
Chloride: 103 mmol/L (ref 98–111)
Creatinine, Ser: 0.72 mg/dL (ref 0.44–1.00)
GFR, Estimated: >60 mL/min (ref >60)
Glucose, Bld: 106 mg/dL — ABNORMAL HIGH (ref 70–99)
Potassium: 2.7 mmol/L — CL (ref 3.5–5.1)
Sodium: 134 mmol/L — ABNORMAL LOW (ref 135–145)
Total Bilirubin: 0.4 mg/dL (ref 0.3–1.2)
Total Protein: 5.9 g/dL — ABNORMAL LOW (ref 6.5–8.1)

## 2020-10-14 MED ORDER — POTASSIUM CHLORIDE CRYS ER 20 MEQ PO TBCR
40.0000 meq | EXTENDED_RELEASE_TABLET | Freq: Once | ORAL | Status: AC
  Administered 2020-10-14: 40 meq via ORAL
  Filled 2020-10-14: qty 2

## 2020-10-14 MED ORDER — POTASSIUM CHLORIDE CRYS ER 20 MEQ PO TBCR
30.0000 meq | EXTENDED_RELEASE_TABLET | ORAL | Status: AC
  Administered 2020-10-14 (×2): 30 meq via ORAL
  Filled 2020-10-14 (×2): qty 1

## 2020-10-14 MED ORDER — POTASSIUM CHLORIDE CRYS ER 20 MEQ PO TBCR: 20.0000 meq | EXTENDED_RELEASE_TABLET | Freq: Two times a day (BID) | ORAL | 0 refills | Status: DC

## 2020-10-14 MED ORDER — POTASSIUM CHLORIDE 10 MEQ/100ML IV SOLN
10.0000 meq | INTRAVENOUS | Status: AC
  Administered 2020-10-14 (×2): 10 meq via INTRAVENOUS
  Filled 2020-10-14 (×2): qty 100

## 2020-10-14 NOTE — Progress Notes (Signed)
Cynthia Alexander to be D/C'd  per MD order. Discussed with the patient and all questions fully answered.  VSS, Skin clean, dry and intact without evidence of skin break down, no evidence of skin tears noted.  IV catheter discontinued intact. Site without signs and symptoms of complications. Dressing and pressure applied.  An After Visit Summary was printed and given to the patient. Patient received prescription.  D/c education completed with patient/family including follow up instructions, medication list, d/c activities limitations if indicated, with other d/c instructions as indicated by MD - patient able to verbalize understanding, all questions fully answered.   Patient instructed to return to ED, call 911, or call MD for any changes in condition.   Patient to be escorted via [place], and D/C home via private auto.

## 2020-10-14 NOTE — Progress Notes (Incomplete)
Subjective: Since I last evaluated the patient ***  Objective: Vital signs in last 24 hours: Temp:  [98 F (36.7 C)-98.1 F (36.7 C)] 98 F (36.7 C) (04/06 1313) Pulse Rate:  [56-62] 56 (04/06 1313) Resp:  [16-17] 16 (04/06 1313) BP: (144-152)/(75-82) 150/81 (04/06 1313) SpO2:  [95 %-97 %] 97 % (04/06 1313) Last BM Date: 10/13/20  Intake/Output from previous day: 04/05 0701 - 04/06 0700 In: 1372.6 [P.O.:600; I.V.:389.1; IV Piggyback:383.5] Out: -  Intake/Output this shift: Total I/O In: 300 [P.O.:300] Out: -   {JG:[id number]}  Lab Results: Recent Labs    10/12/20 2048 10/14/20 0031  WBC 13.8* 6.2  HGB 13.0 11.4*  HCT 37.1 32.6*  PLT 285 252   BMET Recent Labs    10/13/20 0532 10/13/20 1407 10/14/20 0031  NA 135 136 134*  K 2.5* 2.9* 2.7*  CL 103 107 103  CO2 24 23 24   GLUCOSE 94 123* 106*  BUN 8 7* <5*  CREATININE 0.84 0.67 0.72  CALCIUM 8.1* 8.2* 8.4*   LFT Recent Labs    10/14/20 0031  PROT 5.9*  ALBUMIN 3.2*  AST 17  ALT 14  ALKPHOS 52  BILITOT 0.4   PT/INR No results for input(s): LABPROT, INR in the last 72 hours. Hepatitis Panel No results for input(s): HEPBSAG, HCVAB, HEPAIGM, HEPBIGM in the last 72 hours. C-Diff No results for input(s): CDIFFTOX in the last 72 hours. No results for input(s): CDIFFPCR in the last 72 hours. Fecal Lactopherrin No results for input(s): FECLLACTOFRN in the last 72 hours.  Studies/Results: CT Abdomen Pelvis W Contrast  Result Date: 10/12/2020 CLINICAL DATA:  Abdominal distension. Constipation and rectal pain from hemorrhoids. EXAM: CT ABDOMEN AND PELVIS WITH CONTRAST TECHNIQUE: Multidetector CT imaging of the abdomen and pelvis was performed using the standard protocol following bolus administration of intravenous contrast. CONTRAST:  167mL OMNIPAQUE IOHEXOL 300 MG/ML  SOLN COMPARISON:  None. FINDINGS: Lower chest: Bilateral lower lobe subsegmental atelectasis. Hepatobiliary: Subcentimeter hypodensities are too  small to characterize. No focal liver abnormality. No gallstones, gallbladder wall thickening, or pericholecystic fluid. No biliary dilatation. Pancreas: No focal lesion. Normal pancreatic contour. No surrounding inflammatory changes. The main pancreatic duct measures at the upper limits of normal. Suggestion of several hypodense outpouching along the main pancreatic duct that are poorly visualized (2:18). Spleen: Normal in size without focal abnormality. Adrenals/Urinary Tract: No adrenal nodule bilaterally. Bilateral kidneys enhance symmetrically. Subcentimeter hypodensities are too small to characterize. No hydronephrosis. No hydroureter. The urinary bladder is unremarkable. Stomach/Bowel: Stomach is within normal limits. No evidence of bowel wall thickening or dilatation. Stool throughout the colon. 5 cm stool ball within the rectum with associated rectal wall thickening and marked perirectal fat stranding. No definite pneumatosis. Appendix appears normal. Vascular/Lymphatic: No abdominal aorta or iliac aneurysm. Moderate severe calcified and noncalcified atherosclerotic plaque of the aorta and its branches. No abdominal, pelvic, or inguinal lymphadenopathy. Reproductive: Status post hysterectomy. No adnexal masses. Other: No intraperitoneal free gas. No organized fluid collection. Musculoskeletal: No abdominal wall hernia or abnormality. No suspicious lytic or blastic osseous lesions. No acute displaced fracture. IMPRESSION: 1. Constipation with rectal wall thickening and marked perirectal fat stranding. Findings concerning for stercoral colitis. Underlying malignancy is not excluded. 2. Main pancreatic duct measures at the upper limits of normal with suggestion of a couple of hypodense outpouchings that could represent side-branch intraductal papillary mucinous neoplasm. Recommend MRI pancreatic protocol further evaluation Electronically Signed   By: Iven Finn M.D.   On: 10/12/2020 22:48  Medications: {medication reviewed/display:[id number]}  Assessment/Plan: ***  LOS: 0 days   Cynthia Alexander 10/14/2020, 2:58 PM

## 2020-10-14 NOTE — Progress Notes (Signed)
Pt had a BM--diarrhea at 1520.  Pt tolerating diet.  Potassium lab 3.3.

## 2020-10-19 DIAGNOSIS — K5289 Other specified noninfective gastroenteritis and colitis: Secondary | ICD-10-CM | POA: Diagnosis not present

## 2020-10-19 DIAGNOSIS — Z09 Encounter for follow-up examination after completed treatment for conditions other than malignant neoplasm: Secondary | ICD-10-CM | POA: Diagnosis not present

## 2020-10-19 DIAGNOSIS — K5641 Fecal impaction: Secondary | ICD-10-CM | POA: Diagnosis not present

## 2020-10-19 DIAGNOSIS — I10 Essential (primary) hypertension: Secondary | ICD-10-CM | POA: Diagnosis not present

## 2020-10-19 DIAGNOSIS — E876 Hypokalemia: Secondary | ICD-10-CM | POA: Diagnosis not present

## 2020-10-20 DIAGNOSIS — R140 Abdominal distension (gaseous): Secondary | ICD-10-CM | POA: Diagnosis not present

## 2020-10-20 DIAGNOSIS — K900 Celiac disease: Secondary | ICD-10-CM | POA: Diagnosis not present

## 2020-10-20 DIAGNOSIS — K5903 Drug induced constipation: Secondary | ICD-10-CM | POA: Diagnosis not present

## 2020-10-20 DIAGNOSIS — R933 Abnormal findings on diagnostic imaging of other parts of digestive tract: Secondary | ICD-10-CM | POA: Diagnosis not present

## 2020-10-22 ENCOUNTER — Other Ambulatory Visit: Payer: Self-pay | Admitting: Gastroenterology

## 2020-10-22 DIAGNOSIS — R9389 Abnormal findings on diagnostic imaging of other specified body structures: Secondary | ICD-10-CM

## 2020-10-22 NOTE — Discharge Summary (Signed)
[facility] Discharge Summary   Patient: Cynthia Alexander UYQ:[id number]  PCP: Cynthia Cruel, MD  Date of admission: 10/12/2020   Date of discharge: 10/14/2020      Discharge Diagnoses:  Principal diagnosis Constipation  With stercoral colitis  Active Problems:   Colitis   Admitted From: home Disposition:  Home   Recommendations for Outpatient Follow-up:  1. PCP: follow up in 1 week 2. Follow up LABS/TEST:  Bmp and MRI pancreas protocol 3. New Meds: potassium 4. Changed meds: none 5. Stopped meds: maxzide   Follow-up Information    Cynthia Cruel, MD. Schedule an appointment as soon as possible for a visit in 1 week(s).   Specialty: Family Medicine Why: BMP in 1 week , MRI pancreas protocol when medically ready.  Contact information: [place] [address] [phone]              Discharge Instructions    Diet - low sodium heart healthy   Complete by: As directed    Discharge instructions   Complete by: As directed    Take sorbitol for the next 2 to 3 days to ensure you have regular loose bowel motions You will need to take potassium twice a day until he can get labs in 3 to 4 days or at the end of the week at Cynthia Alexander office Some your blood pressure medications can drop your potassium so I have stopped temporarily your Maxide-calcium can constipate you as can vitamin D C do not take them until you follow-up with Cynthia Alexander Challenger I would suggest that you also follow-up with Cynthia Alexander in the outpatient setting for routine follow-up and colonoscopy care   Increase activity slowly   Complete by: As directed       Diet recommendation: Regular diet  Activity: The patient is advised to gradually reintroduce usual activities, as tolerated  Discharge Condition: stable  Code Status: Full code   History of present illness: As per the H and P dictated on admission, "42 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   Celiac sprue depression osteopenia hypothyroid, benign breast lump 05/30/2020 Appears to have had visit at OB/GYN 09/10/2020-  Called PCP office-had forgotten to take various laxatives over the past several days-resultant rectalgia-trial of enema at the instruction of triage nurse--no rsult worsening pain-was told to go to the emergency room  In the emergency room reported to ED severe rectal pressure sensation of fullness ability to only pass small amount of stool Found by emergency room physician to be partially impacted given smog enema and could not disimpact-CT abdomen pelvis showed stercoral colitis with labs showing white count of 13, potassium 2.5 rest of chemistries etc. negative"  Hospital Course:  Summary of her active problems in the hospital is as following.   Fecal impaction. Proctalgia. Stercoral colitis Manual disimpaction done  Enema given  Pt was given Antibiotics but no active infection seen. Antibiotics stopped.  Feeling better, eating okay, having regular BM no bleeding  severe hypokalemia Replaced, continue for short course outpatient Follow up BMP Hold maxzide  HTN  BP stable Hold diuretic in the setting of hypokalemia and constipation   Abnormality on CT scan Incidentally seen to have pancreatic duct at upper limit of normal, with some outpouchings.  Need MRI pancreatic protocol outpt once improves from constipation and colitis  Osteopenia, breast lump Outpatient PCP follow up.   Patient was ambulatory without any assistance. On the day of the discharge the  patient's vitals were stable, and no other acute medical condition were reported by patient. The patient was felt safe to be discharge at Home with no therapy needed on discharge.  Consultants: Gastroenterology  Procedures: noe  DISCHARGE MEDICATION: Allergies as of 10/14/2020      Reactions   Banana Diarrhea   Severe stomach cramps       Medication List    STOP taking these medications    CALCIUM 500 PO   triamterene-hydrochlorothiazide 37.5-25 MG tablet Commonly known as: MAXZIDE-25   Vitamin D3 50 MCG (2000 UT) Tabs     TAKE these medications   ALPRAZolam 0.25 MG tablet Commonly known as: XANAX Take 0.25 mg by mouth at bedtime as needed for sleep.   buPROPion 150 MG 12 hr tablet Commonly known as: WELLBUTRIN SR Take 150 mg by mouth daily.   diltiazem 360 MG 24 hr capsule Commonly known as: CARDIZEM CD Take 360 mg by mouth daily.   docusate sodium 100 MG capsule Commonly known as: COLACE Take 100 mg by mouth 2 (two) times daily.   Fish Oil 1000 MG Caps Take 2,000 mg by mouth daily.   Glucosamine Chondr 1500 Complx Caps Take 2 capsules by mouth daily.   levothyroxine 50 MCG tablet Commonly known as: SYNTHROID Take 50 mcg by mouth daily before breakfast.   multivitamin tablet Take 1 tablet by mouth daily.   PARoxetine 10 MG tablet Commonly known as: PAXIL Take 10 mg by mouth every morning.   polyethylene glycol 17 g packet Commonly known as: MIRALAX / GLYCOLAX Take 17 g by mouth daily.   potassium chloride SA 20 MEQ tablet Commonly known as: KLOR-CON Take 1 tablet (20 mEq total) by mouth 2 (two) times daily.   sorbitol 70 % Soln Take 30 mLs by mouth daily.   traZODone 150 MG tablet Commonly known as: DESYREL Take 75 mg by mouth at bedtime.       Discharge Exam: Filed Weights   10/12/20 1251  Weight: 63.7 kg   Vitals:   10/14/20 1157 10/14/20 1313  BP: (!) 144/82 (!) 150/81  Pulse:  (!) 56  Resp:  16  Temp:  98 F (36.7 C)  SpO2:  97%   General: Appear in mild distress, no Rash; Oral Mucosa Clear, moist. no Abnormal Neck Mass Or lumps, Conjunctiva normal  Cardiovascular: S1 and S2 Present, no Murmur, Respiratory: good respiratory effort, Bilateral Air entry present and CTA, no Crackles, no wheezes Abdomen: Bowel Sound present, Soft and no tenderness Extremities: no Pedal edema Neurology: alert and oriented to time, place,  and person affect appropriate. no new focal deficit Gait not checked due to patient safety concerns  The results of significant diagnostics from this hospitalization (including imaging, microbiology, ancillary and laboratory) are listed below for reference.    Significant Diagnostic Studies: CT Abdomen Pelvis W Contrast  Result Date: 10/12/2020 CLINICAL DATA:  Abdominal distension. Constipation and rectal pain from hemorrhoids. EXAM: CT ABDOMEN AND PELVIS WITH CONTRAST TECHNIQUE: Multidetector CT imaging of the abdomen and pelvis was performed using the standard protocol following bolus administration of intravenous contrast. CONTRAST:  186mL OMNIPAQUE IOHEXOL 300 MG/ML  SOLN COMPARISON:  None. FINDINGS: Lower chest: Bilateral lower lobe subsegmental atelectasis. Hepatobiliary: Subcentimeter hypodensities are too small to characterize. No focal liver abnormality. No gallstones, gallbladder wall thickening, or pericholecystic fluid. No biliary dilatation. Pancreas: No focal lesion. Normal pancreatic contour. No surrounding inflammatory changes. The main pancreatic duct measures at the upper limits of normal. Suggestion of  several hypodense outpouching along the main pancreatic duct that are poorly visualized (2:18). Spleen: Normal in size without focal abnormality. Adrenals/Urinary Tract: No adrenal nodule bilaterally. Bilateral kidneys enhance symmetrically. Subcentimeter hypodensities are too small to characterize. No hydronephrosis. No hydroureter. The urinary bladder is unremarkable. Stomach/Bowel: Stomach is within normal limits. No evidence of bowel wall thickening or dilatation. Stool throughout the colon. 5 cm stool ball within the rectum with associated rectal wall thickening and marked perirectal fat stranding. No definite pneumatosis. Appendix appears normal. Vascular/Lymphatic: No abdominal aorta or iliac aneurysm. Moderate severe calcified and noncalcified atherosclerotic plaque of the aorta and  its branches. No abdominal, pelvic, or inguinal lymphadenopathy. Reproductive: Status post hysterectomy. No adnexal masses. Other: No intraperitoneal free gas. No organized fluid collection. Musculoskeletal: No abdominal wall hernia or abnormality. No suspicious lytic or blastic osseous lesions. No acute displaced fracture. IMPRESSION: 1. Constipation with rectal wall thickening and marked perirectal fat stranding. Findings concerning for stercoral colitis. Underlying malignancy is not excluded. 2. Main pancreatic duct measures at the upper limits of normal with suggestion of a couple of hypodense outpouchings that could represent side-branch intraductal papillary mucinous neoplasm. Recommend MRI pancreatic protocol further evaluation Electronically Signed   By: Iven Finn M.D.   On: 10/12/2020 22:48    Microbiology: Recent Results (from the past 240 hour(s))  Resp Panel by RT-PCR (Flu A&B, Covid) Nasopharyngeal Swab     Status: None   Collection Time: 10/13/20 12:34 AM   Specimen: Nasopharyngeal Swab; Nasopharyngeal(NP) swabs in vial transport medium  Result Value Ref Range Status   SARS Coronavirus 2 by RT PCR NEGATIVE NEGATIVE Final    Comment: (NOTE) SARS-CoV-2 target nucleic acids are NOT DETECTED.  The SARS-CoV-2 RNA is generally detectable in upper respiratory specimens during the acute phase of infection. The lowest concentration of SARS-CoV-2 viral copies this assay can detect is 138 copies/mL. A negative result does not preclude SARS-Cov-2 infection and should not be used as the sole basis for treatment or other patient management decisions. A negative result may occur with  improper specimen collection/handling, submission of specimen other than nasopharyngeal swab, presence of viral mutation(s) within the areas targeted by this assay, and inadequate number of viral copies(<138 copies/mL). A negative result must be combined with clinical observations, patient history, and  epidemiological information. The expected result is Negative.  Fact Sheet for Patients:  EntrepreneurPulse.com.au  Fact Sheet for Healthcare Providers:  IncredibleEmployment.be  This test is no t yet approved or cleared by the [place] FDA and  has been authorized for detection and/or diagnosis of SARS-CoV-2 by FDA under an Emergency Use Authorization (EUA). This EUA will remain  in effect (meaning this test can be used) for the duration of the COVID-19 declaration under Section 564(b)(1) of the Act, 21 U.S.C.section 360bbb-3(b)(1), unless the authorization is terminated  or revoked sooner.       Influenza A by PCR NEGATIVE NEGATIVE Final   Influenza B by PCR NEGATIVE NEGATIVE Final    Comment: (NOTE) The Xpert Xpress SARS-CoV-2/FLU/RSV plus assay is intended as an aid in the diagnosis of influenza from Nasopharyngeal swab specimens and should not be used as a sole basis for treatment. Nasal washings and aspirates are unacceptable for Xpert Xpress SARS-CoV-2/FLU/RSV testing.  Fact Sheet for Patients: EntrepreneurPulse.com.au  Fact Sheet for Healthcare Providers: IncredibleEmployment.be  This test is not yet approved or cleared by the [place] FDA and has been authorized for detection and/or diagnosis of SARS-CoV-2 by FDA under an Emergency Use  Authorization (EUA). This EUA will remain in effect (meaning this test can be used) for the duration of the COVID-19 declaration under Section 564(b)(1) of the Act, 21 U.S.C. section 360bbb-3(b)(1), unless the authorization is terminated or revoked.  Performed at [facility], [address] [place], [place] [address]      Labs: CBC: No results for input(s): WBC, NEUTROABS, HGB, HCT, MCV, PLT in the last 168 hours. Basic Metabolic Panel: No results for input(s): NA, K, CL, CO2, GLUCOSE, BUN, CREATININE, CALCIUM, MG, PHOS in the last 168  hours. Liver Function Tests: No results for input(s): AST, ALT, ALKPHOS, BILITOT, PROT, ALBUMIN in the last 168 hours. CBG: No results for input(s): GLUCAP in the last 168 hours.  Time spent: 35 minutes  Signed:  Berle Mull  [facility] 10/14/2020

## 2020-11-05 ENCOUNTER — Other Ambulatory Visit: Payer: Self-pay | Admitting: Obstetrics and Gynecology

## 2020-11-05 DIAGNOSIS — Z1231 Encounter for screening mammogram for malignant neoplasm of breast: Secondary | ICD-10-CM

## 2020-11-09 ENCOUNTER — Ambulatory Visit
Admission: RE | Admit: 2020-11-09 | Discharge: 2020-11-09 | Disposition: A | Discharge status: Home / Self Care | Payer: Medicare HMO | Source: Ambulatory Visit | Attending: Gastroenterology | Admitting: Gastroenterology

## 2020-11-09 ENCOUNTER — Other Ambulatory Visit: Payer: Self-pay

## 2020-11-09 DIAGNOSIS — K862 Cyst of pancreas: Secondary | ICD-10-CM | POA: Diagnosis not present

## 2020-11-09 DIAGNOSIS — R935 Abnormal findings on diagnostic imaging of other abdominal regions, including retroperitoneum: Secondary | ICD-10-CM | POA: Diagnosis not present

## 2020-11-09 DIAGNOSIS — R9389 Abnormal findings on diagnostic imaging of other specified body structures: Secondary | ICD-10-CM

## 2020-11-09 DIAGNOSIS — K7689 Other specified diseases of liver: Secondary | ICD-10-CM | POA: Diagnosis not present

## 2020-11-09 IMAGING — MR MR ABDOMEN WO/W CM MRCP
16 of 20 series · 37 of 48 positions shown · IV contrast (multihance)
Comparison: CT [DATE]

CLINICAL DATA: Pancreatic cystic lesions identified on CT. MRI
recommended for further characterization

EXAM:
MRI ABDOMEN WITHOUT AND WITH CONTRAST (INCLUDING MRCP)
TECHNIQUE: Multiplanar multisequence MR imaging of the abdomen was performed
both before and after the administration of intravenous contrast.
Heavily T2-weighted images of the biliary and pancreatic ducts were
obtained, and three-dimensional MRCP images were rendered by post
processing.
CONTRAST:  13mL MULTIHANCE GADOBENATE DIMEGLUMINE 529 MG/ML IV SOLN

[Series 3: T2 · coronal · 5.0mm · 1.56mm/px · 1 of 32 slices shown (1 of 4)]
[im 1/32]
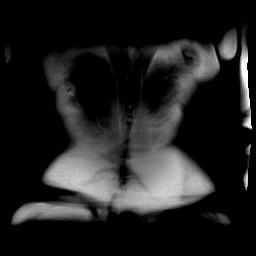

[Series 4: T1 · axial · 3.0mm · 1.19mm/px · z∈[-10,+179]mm · 4 of 128 slices shown]
[im 1/128]
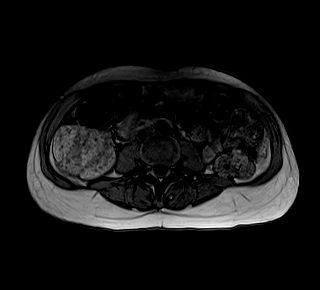
[im 43/128]
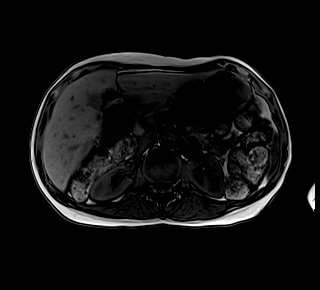
[im 85/128]
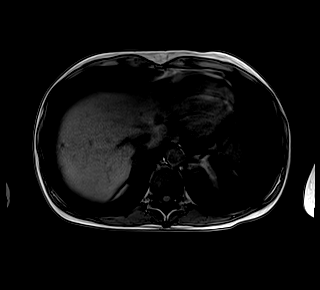
[im 128/128]
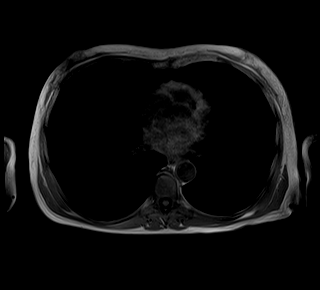

[Series 5: T2 · axial · 5.0mm · 1.48mm/px · 1 of 38 slices shown (2 of 4)]
[im 1/38]
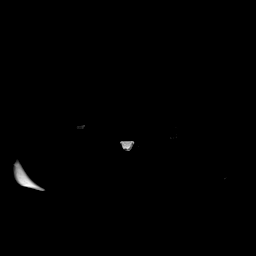

[Series 8: T2 · axial · 6.0mm · 1.22mm/px · 1 of 30 slices shown (3 of 4)]
[im 1/30]
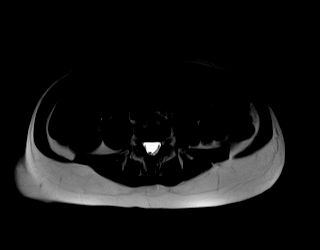

[Series 9: MRCP · coronal · 1.0mm · 0.49mm/px · 3 of 80 slices shown]
[im 1/80]
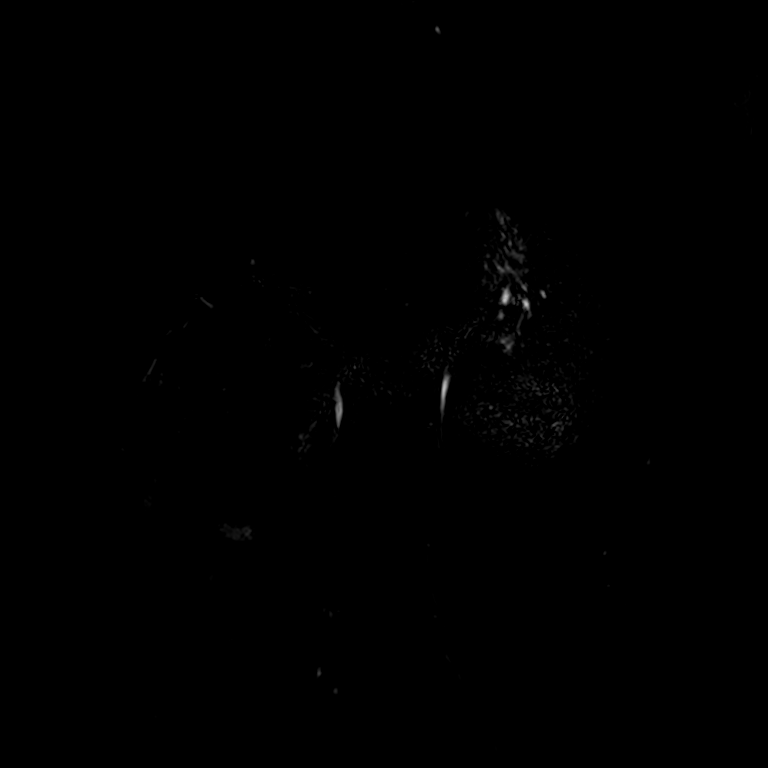
[im 40/80]
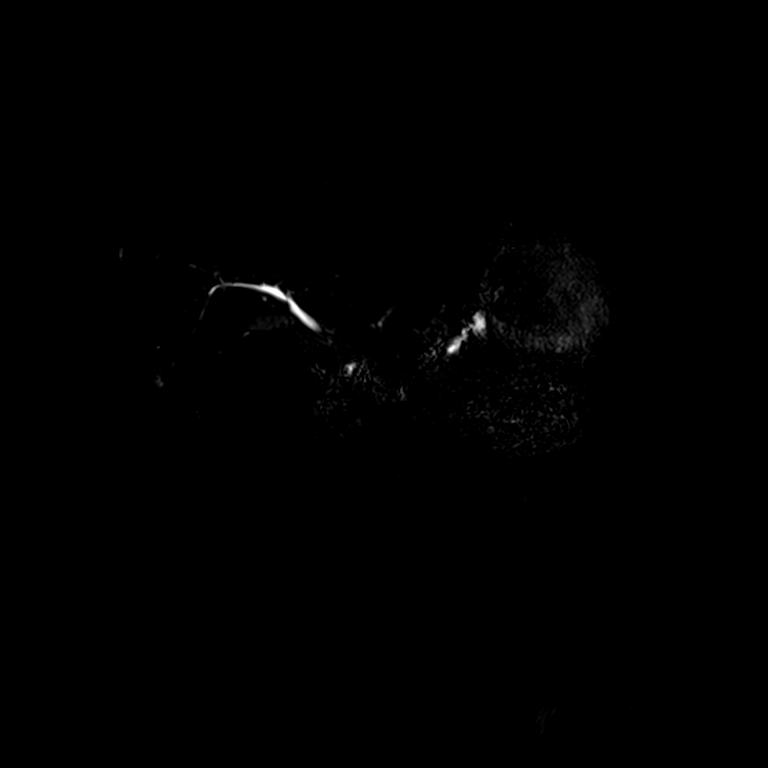
[im 80/80]
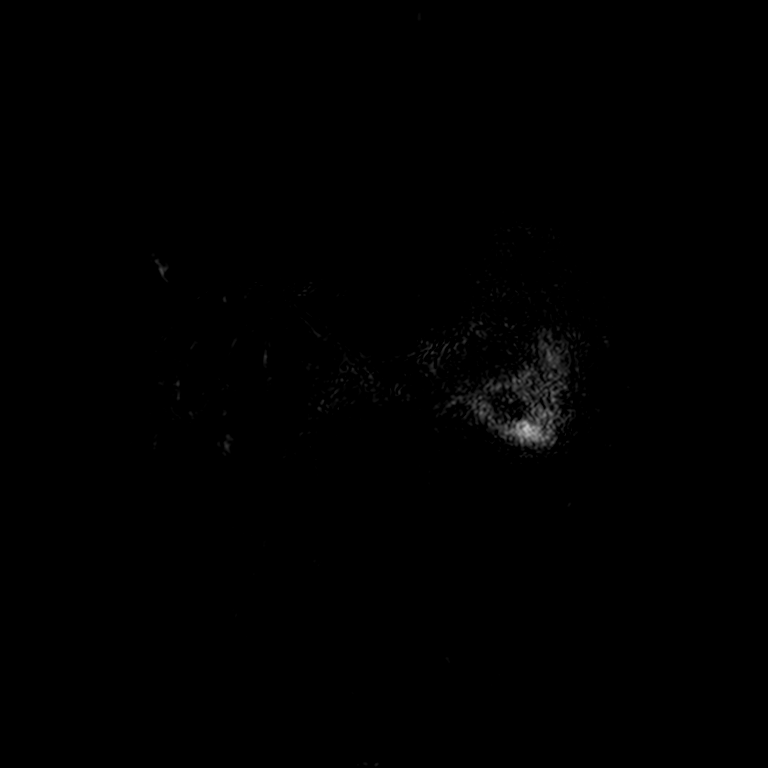

[Series 12: DWI · axial · 5.0mm · 1.42mm/px · z∈[-29,+181]mm · 4 of 108 slices shown (1 of 2)]
[im 1/108]
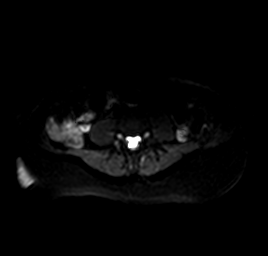
[im 36/108]
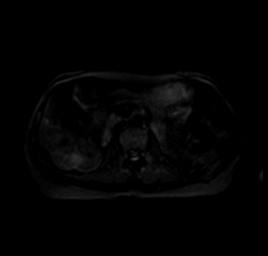
[im 72/108]
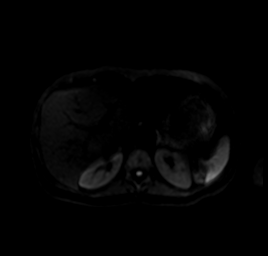
[im 108/108]
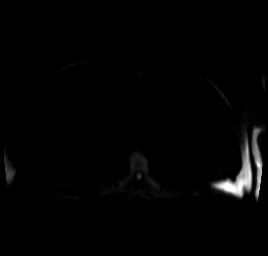

[Series 13: DWI · axial · 5.0mm · 1.42mm/px · 1 of 36 slices shown (2 of 2)]
[im 1/36]
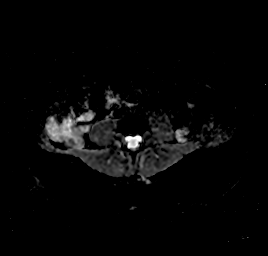

[Series 14: bSSFP · axial · 5.0mm · 1.25mm/px · 1 of 38 slices shown]
[im 1/38]
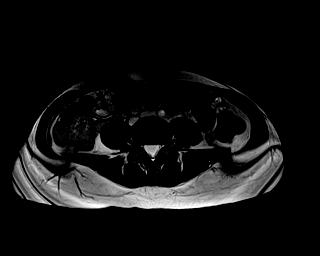

[Series 15: T2 · coronal · 3.0mm · 1.19mm/px · 1 of 19 slices shown (4 of 4)]
[im 1/19]
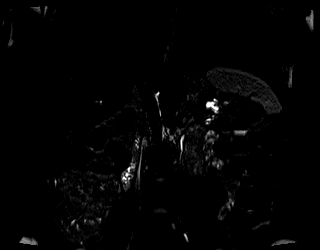

[Series 16: T1 dynamic · axial · non-contrast · 3.0mm · 1.25mm/px · z∈[-37,+176]mm · 3 of 72 slices shown]
[im 1/72]
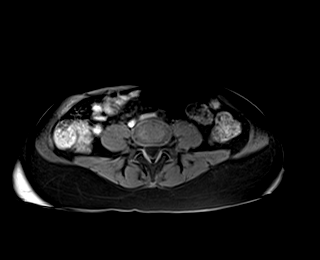
[im 36/72]
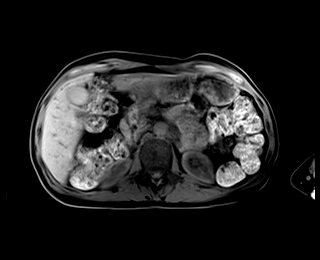
[im 72/72]
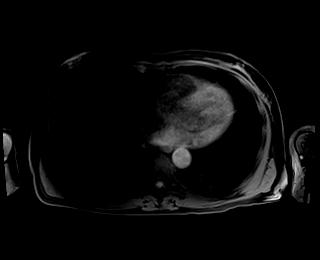

[Series 17: T1 dynamic post-contrast · axial · 3.0mm · 1.25mm/px · z∈[-37,+176]mm · 3 of 72 slices shown (1 of 6)]
[im 1/72]
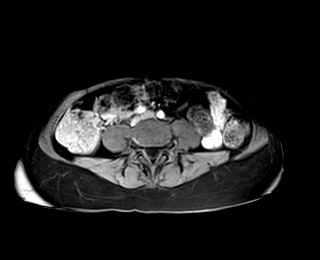
[im 36/72]
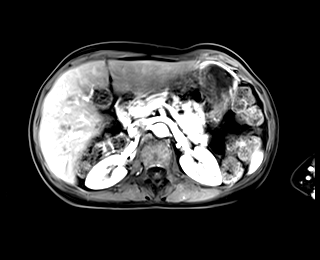
[im 72/72]
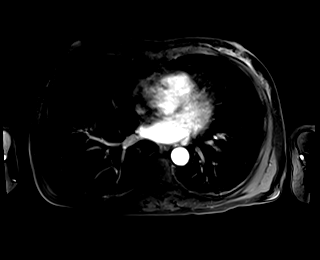

[Series 18: T1 dynamic post-contrast · axial · 3.0mm · 1.25mm/px · z∈[-37,+176]mm · 3 of 72 slices shown (2 of 6)]
[im 1/72]
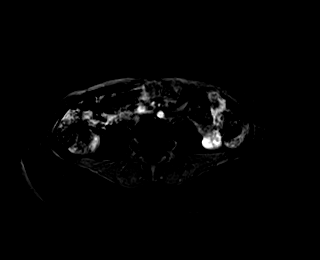
[im 36/72]
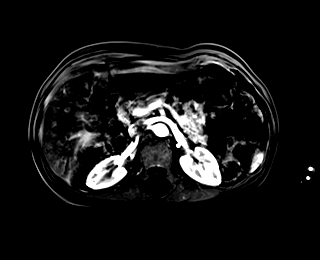
[im 72/72]
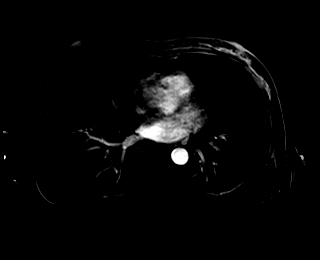

[Series 19: T1 dynamic post-contrast · axial · 3.0mm · 1.25mm/px · z∈[-37,+176]mm · 3 of 72 slices shown (3 of 6)]
[im 1/72]
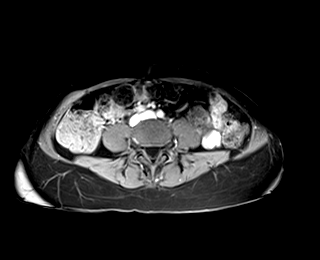
[im 36/72]
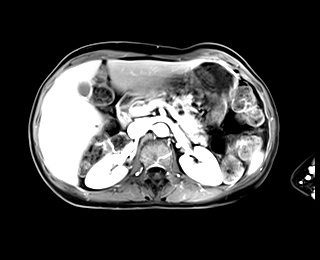
[im 72/72]
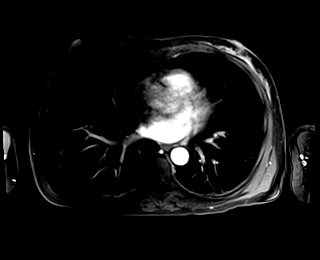

[Series 20: T1 dynamic post-contrast · axial · 3.0mm · 1.25mm/px · z∈[-37,+176]mm · 3 of 72 slices shown (4 of 6)]
[im 1/72]
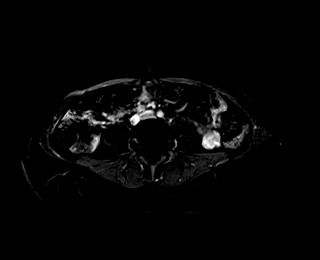
[im 36/72]
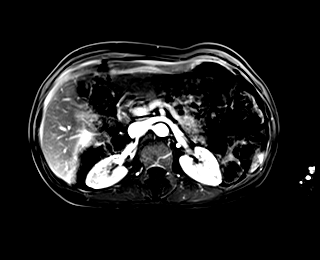
[im 72/72]
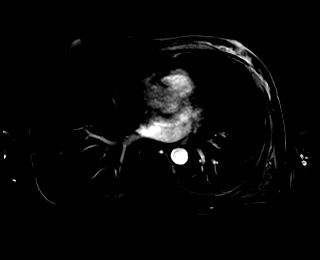

[Series 21: T1 dynamic post-contrast · axial · 3.0mm · 1.25mm/px · z∈[-37,+176]mm · 3 of 72 slices shown (5 of 6)]
[im 1/72]
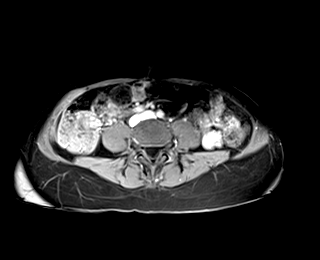
[im 36/72]
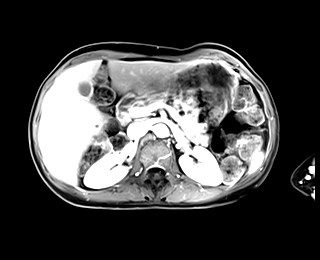
[im 72/72]
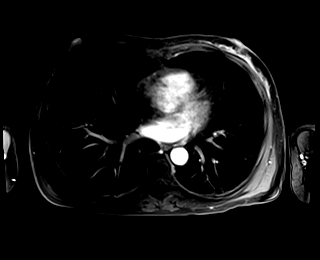

[Series 22: T1 dynamic post-contrast · axial · 3.0mm · 1.25mm/px · z∈[-37,+68]mm · 2 of 72 slices shown (6 of 6)]
[im 1/72]
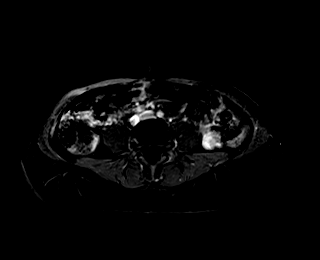
[im 36/72]
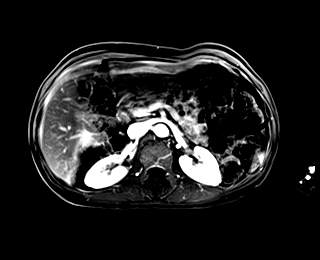

[37 of 48 positions shown; findings below may reference images not displayed]

FINDINGS: Lower chest: Lung bases are clear.

Hepatobiliary: Several small benign subcentimeter cystic lesions in
the LEFT and RIGHT hepatic lobe. Gallbladder is normal. No
intrahepatic or extrahepatic biliary duct dilatation. Common bile
duct normal caliber.

Pancreas: Probable variant pancreatic ductal anatomy (ductus
divisum). Pancreatic duct is normal caliber; however, there are
multiple side branch cystic lesions along the body and tail of the
pancreas. These cystic lesions are unilocular without enhancement
and are adjacent to the pancreatic duct and may communicate with
duct; however, there is no duct dilatation as above.

Example cystic lesion in the mid body the pancreas measures 8 mm
(image 19/series 5). Example lesion the tail the pancreas measures 6
mm (image 16). Example lesion the genu measures 8 mm (image 18).

There is no pancreatic atrophy or abnormal enhancement.

Spleen: Normal spleen

Adrenals/urinary tract: Adrenal glands and kidneys are normal. The
ureters and bladder normal.

Stomach/Bowel: Stomach normal. Large volume stool in the ascending
colon as seen on comparison CT. No lymphadenopathy in the upper
abdomen

Vascular/Lymphatic: Abdominal aorta is normal caliber. No periportal
or retroperitoneal adenopathy. No pelvic adenopathy.

Musculoskeletal: No aggressive osseous lesion.
IMPRESSION: 1. Multiple small unilocular cystic lesions along the entirety of
the pancreatic duct most suggestive multiple side branch IPMTs
versus less likely post inflammatory cystic change. No evidence of
duct dilatation. Per consensus criteria, recommend follow-up MRI in
12 months. This recommendation follows ACR consensus guidelines:
Management of Incidental Pancreatic Cysts: A White Paper of the ACR
Incidental Findings Committee. [HOSPITAL] [[place]];[DATE].
2. Probable variant ductal anatomy (pancreatic ductal divisum).
3. Normal common bile duct.  No intrahepatic duct dilatation.
4. Benign hepatic cysts
5. Large volume stool in RIGHT colon.

## 2020-11-09 MED ORDER — GADOBENATE DIMEGLUMINE 529 MG/ML IV SOLN
13.0000 mL | Freq: Once | INTRAVENOUS | Status: AC | PRN
  Administered 2020-11-09: 13 mL via INTRAVENOUS

## 2020-11-20 DIAGNOSIS — H9202 Otalgia, left ear: Secondary | ICD-10-CM | POA: Diagnosis not present

## 2020-11-20 DIAGNOSIS — R6884 Jaw pain: Secondary | ICD-10-CM | POA: Diagnosis not present

## 2020-11-30 DIAGNOSIS — R933 Abnormal findings on diagnostic imaging of other parts of digestive tract: Secondary | ICD-10-CM | POA: Diagnosis not present

## 2020-11-30 DIAGNOSIS — Z1211 Encounter for screening for malignant neoplasm of colon: Secondary | ICD-10-CM | POA: Diagnosis not present

## 2020-12-02 DIAGNOSIS — H524 Presbyopia: Secondary | ICD-10-CM | POA: Diagnosis not present

## 2020-12-31 DIAGNOSIS — R69 Illness, unspecified: Secondary | ICD-10-CM | POA: Diagnosis not present

## 2020-12-31 DIAGNOSIS — I10 Essential (primary) hypertension: Secondary | ICD-10-CM | POA: Diagnosis not present

## 2020-12-31 DIAGNOSIS — M8580 Other specified disorders of bone density and structure, unspecified site: Secondary | ICD-10-CM | POA: Diagnosis not present

## 2020-12-31 DIAGNOSIS — E7800 Pure hypercholesterolemia, unspecified: Secondary | ICD-10-CM | POA: Diagnosis not present

## 2020-12-31 DIAGNOSIS — E039 Hypothyroidism, unspecified: Secondary | ICD-10-CM | POA: Diagnosis not present

## 2020-12-31 DIAGNOSIS — N183 Chronic kidney disease, stage 3 unspecified: Secondary | ICD-10-CM | POA: Diagnosis not present

## 2021-01-27 DIAGNOSIS — K5900 Constipation, unspecified: Secondary | ICD-10-CM | POA: Diagnosis not present

## 2021-01-27 DIAGNOSIS — F419 Anxiety disorder, unspecified: Secondary | ICD-10-CM | POA: Diagnosis not present

## 2021-01-27 DIAGNOSIS — N189 Chronic kidney disease, unspecified: Secondary | ICD-10-CM | POA: Diagnosis not present

## 2021-01-27 DIAGNOSIS — R69 Illness, unspecified: Secondary | ICD-10-CM | POA: Diagnosis not present

## 2021-01-27 DIAGNOSIS — E039 Hypothyroidism, unspecified: Secondary | ICD-10-CM | POA: Diagnosis not present

## 2021-01-27 DIAGNOSIS — M8580 Other specified disorders of bone density and structure, unspecified site: Secondary | ICD-10-CM | POA: Diagnosis not present

## 2021-01-27 DIAGNOSIS — E785 Hyperlipidemia, unspecified: Secondary | ICD-10-CM | POA: Diagnosis not present

## 2021-01-27 DIAGNOSIS — I129 Hypertensive chronic kidney disease with stage 1 through stage 4 chronic kidney disease, or unspecified chronic kidney disease: Secondary | ICD-10-CM | POA: Diagnosis not present

## 2021-01-27 DIAGNOSIS — E739 Lactose intolerance, unspecified: Secondary | ICD-10-CM | POA: Diagnosis not present

## 2021-01-27 DIAGNOSIS — G4700 Insomnia, unspecified: Secondary | ICD-10-CM | POA: Diagnosis not present

## 2021-03-08 ENCOUNTER — Ambulatory Visit
Admission: RE | Admit: 2021-03-08 | Discharge: 2021-03-08 | Disposition: A | Discharge status: Home / Self Care | Payer: Medicare HMO | Source: Ambulatory Visit | Attending: Obstetrics and Gynecology | Admitting: Obstetrics and Gynecology

## 2021-03-08 ENCOUNTER — Other Ambulatory Visit: Payer: Self-pay

## 2021-03-08 DIAGNOSIS — Z1231 Encounter for screening mammogram for malignant neoplasm of breast: Secondary | ICD-10-CM | POA: Diagnosis not present

## 2021-03-08 IMAGING — MG MM DIGITAL SCREENING BILAT W/ TOMO AND CAD
8 series · 9 of 24 positions shown · non-contrast
Comparison: Previous exam(s).

CLINICAL DATA: Screening.

EXAM:
DIGITAL SCREENING BILATERAL MAMMOGRAM WITH TOMOSYNTHESIS AND CAD
TECHNIQUE: Bilateral screening digital craniocaudal and mediolateral oblique
mammograms were obtained. Bilateral screening digital breast
tomosynthesis was performed. The images were evaluated with
computer-aided detection.

[R CC synth-2D]
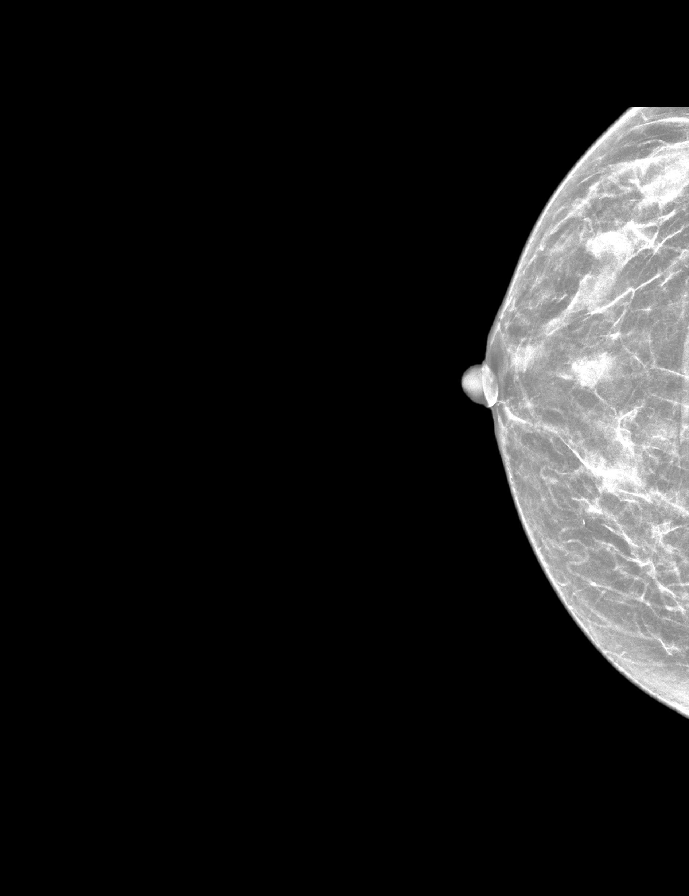

[L CC synth-2D]
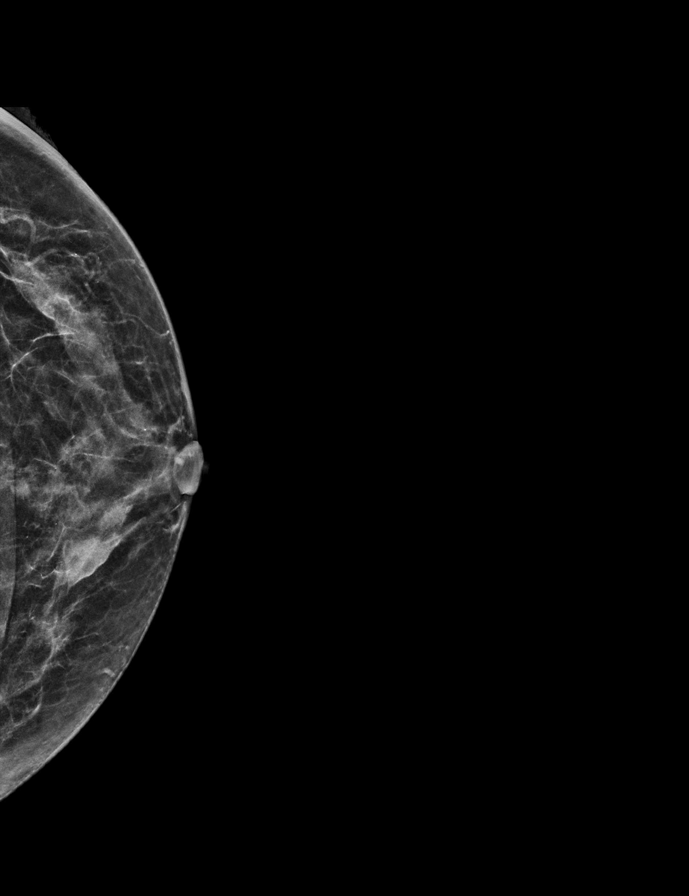

[L MLO synth-2D]
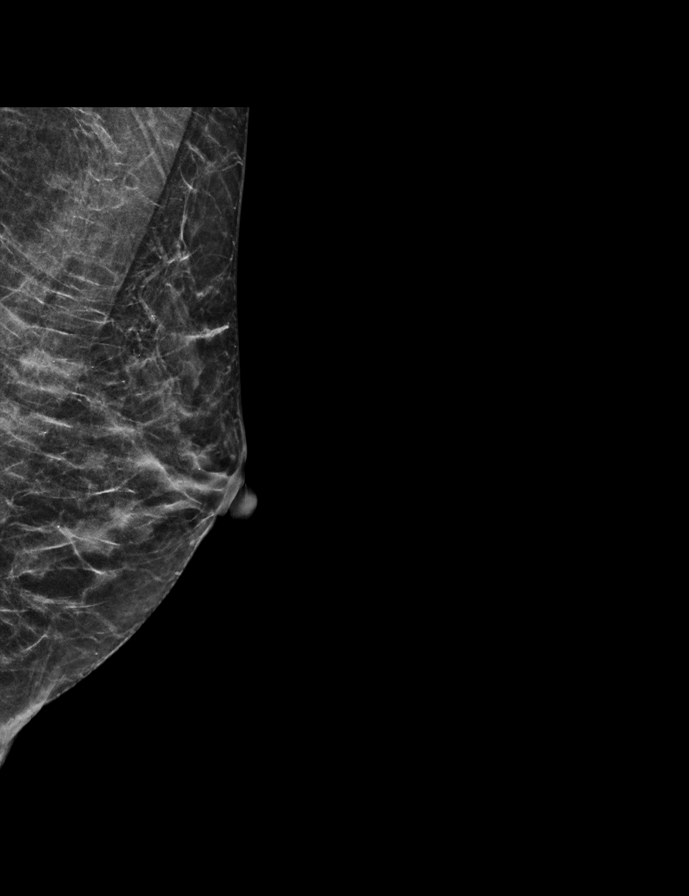

[R MLO synth-2D]
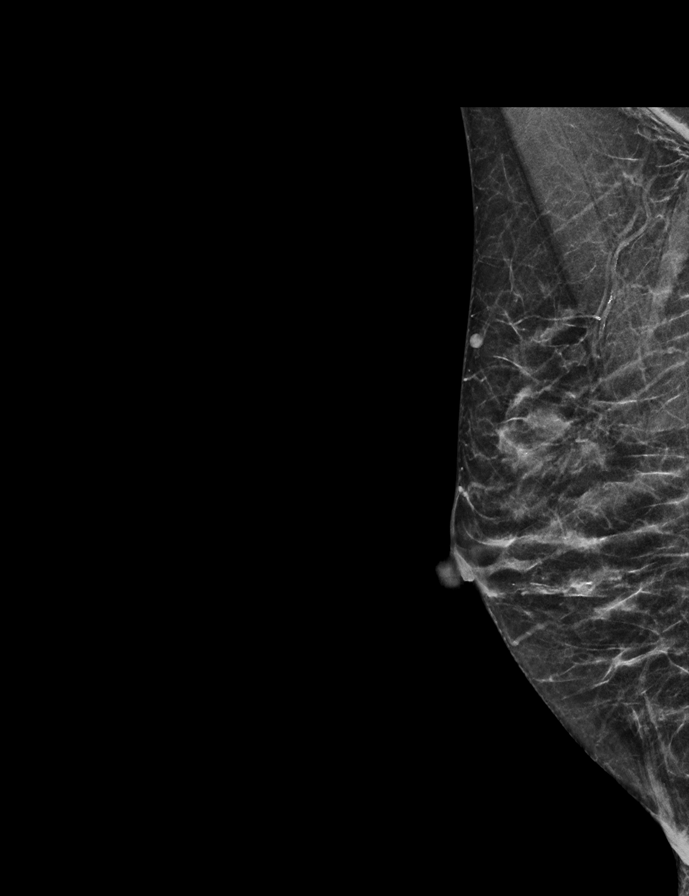

[R MLO tomo · 2 of 41 frames shown]
[frame 14/41]
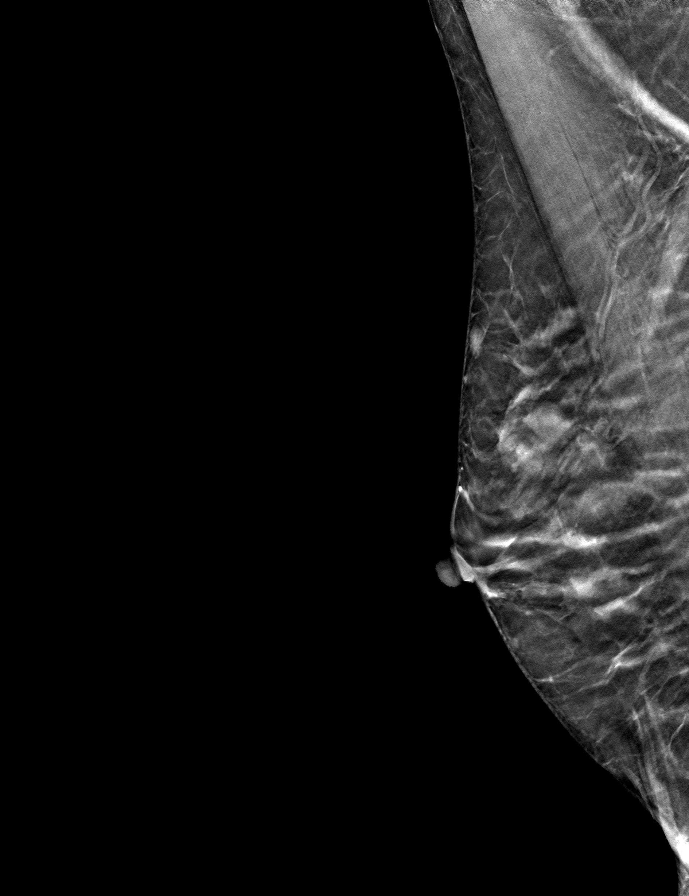
[frame 21/41]
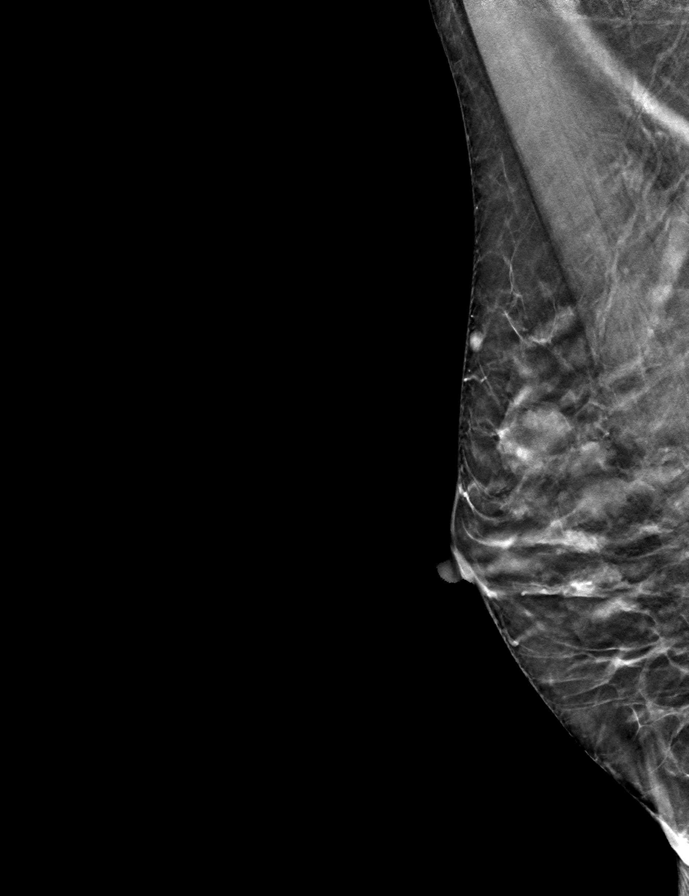

[L CC tomo · tomo slice 22/43.0]
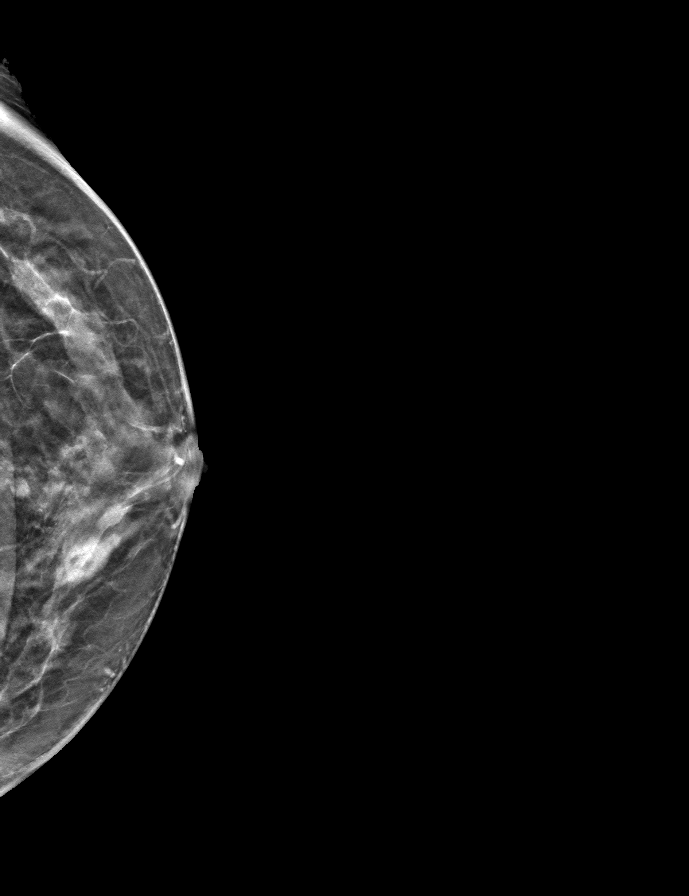

[L MLO tomo · tomo slice 21/41.0]
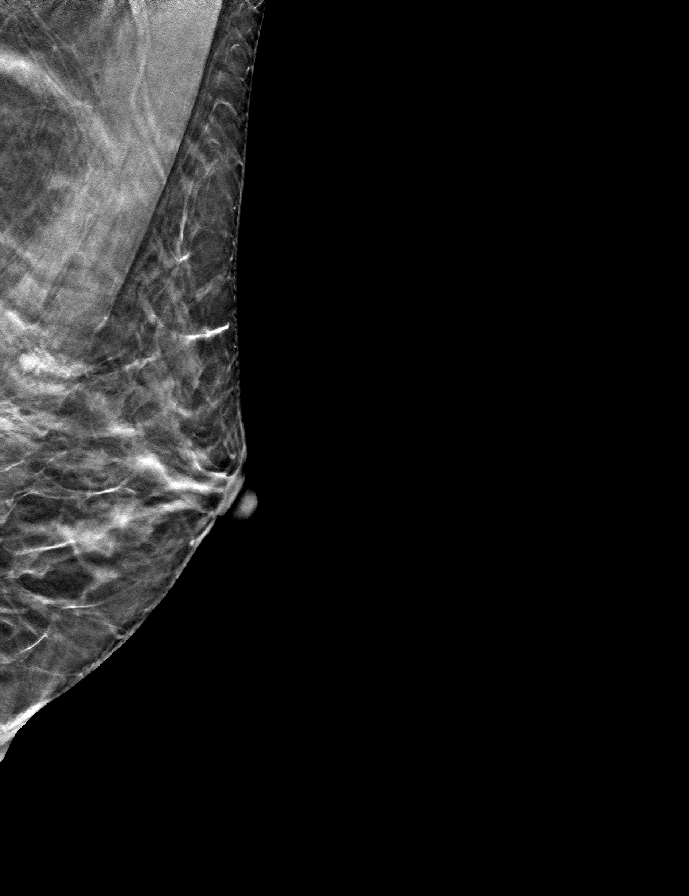

[R CC tomo · tomo slice 21/40.0]
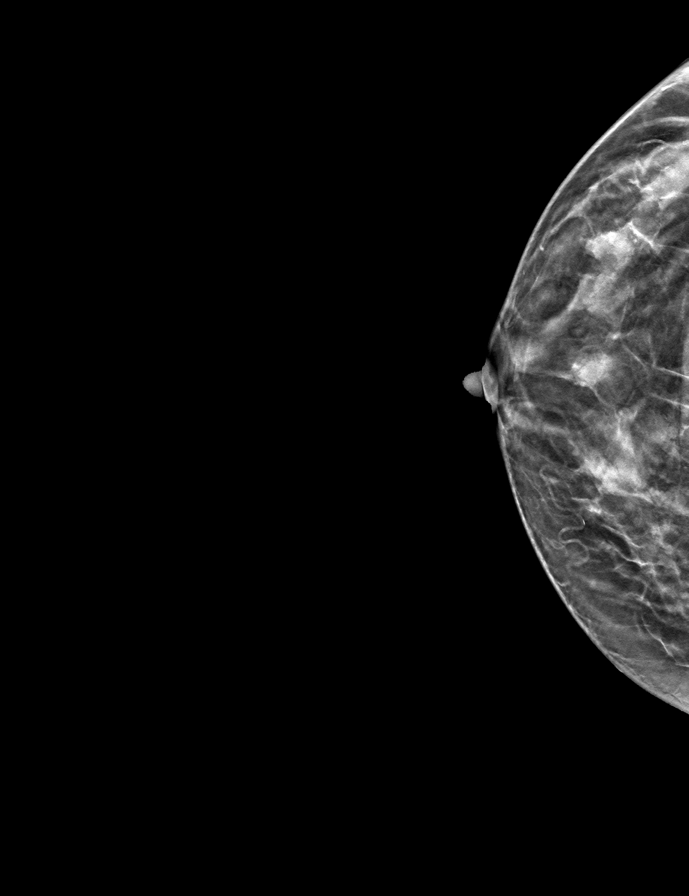

[9 of 24 positions shown; findings below may reference images not displayed]

ACR Breast Density Category b: There are scattered areas of
fibroglandular density.
FINDINGS: In the left breast, a possible asymmetry warrants further
evaluation. In the right breast, no findings suspicious for
malignancy.
IMPRESSION: Further evaluation is suggested for possible asymmetry in the left
breast.

RECOMMENDATION:
Diagnostic mammogram and possibly ultrasound of the left breast.
(Code:[1K])

The patient will be contacted regarding the findings, and additional
imaging will be scheduled.

BI-RADS CATEGORY  0: Incomplete. Need additional imaging evaluation
and/or prior mammograms for comparison.

## 2021-03-16 ENCOUNTER — Other Ambulatory Visit: Payer: Self-pay | Admitting: Obstetrics and Gynecology

## 2021-03-16 DIAGNOSIS — R928 Other abnormal and inconclusive findings on diagnostic imaging of breast: Secondary | ICD-10-CM

## 2021-03-24 ENCOUNTER — Ambulatory Visit: Payer: Medicare HMO

## 2021-03-29 DIAGNOSIS — I10 Essential (primary) hypertension: Secondary | ICD-10-CM | POA: Diagnosis not present

## 2021-03-29 DIAGNOSIS — E7800 Pure hypercholesterolemia, unspecified: Secondary | ICD-10-CM | POA: Diagnosis not present

## 2021-03-29 DIAGNOSIS — M8580 Other specified disorders of bone density and structure, unspecified site: Secondary | ICD-10-CM | POA: Diagnosis not present

## 2021-03-29 DIAGNOSIS — E039 Hypothyroidism, unspecified: Secondary | ICD-10-CM | POA: Diagnosis not present

## 2021-03-29 DIAGNOSIS — R69 Illness, unspecified: Secondary | ICD-10-CM | POA: Diagnosis not present

## 2021-03-29 DIAGNOSIS — N183 Chronic kidney disease, stage 3 unspecified: Secondary | ICD-10-CM | POA: Diagnosis not present

## 2021-04-05 ENCOUNTER — Ambulatory Visit
Admission: RE | Admit: 2021-04-05 | Discharge: 2021-04-05 | Disposition: A | Discharge status: Home / Self Care | Payer: Medicare HMO | Source: Ambulatory Visit | Attending: Obstetrics and Gynecology | Admitting: Obstetrics and Gynecology

## 2021-04-05 ENCOUNTER — Other Ambulatory Visit: Payer: Self-pay

## 2021-04-05 DIAGNOSIS — R922 Inconclusive mammogram: Secondary | ICD-10-CM | POA: Diagnosis not present

## 2021-04-05 DIAGNOSIS — R928 Other abnormal and inconclusive findings on diagnostic imaging of breast: Secondary | ICD-10-CM

## 2021-04-05 IMAGING — US US BREAST*L* LIMITED INC AXILLA
1 series · 6 of 6 positions shown · non-contrast
Comparison: Previous exam(s).

CLINICAL DATA: Recall from screening mammography, possible focal
asymmetry involving the inner LEFT breast at anterior to middle
depth.

EXAM:
DIGITAL DIAGNOSTIC UNILATERAL LEFT MAMMOGRAM WITH TOMOSYNTHESIS AND
CAD; ULTRASOUND LEFT BREAST LIMITED
TECHNIQUE: Left digital diagnostic mammography and breast tomosynthesis was
performed. The images were evaluated with computer-aided detection.;
Targeted ultrasound examination of the left breast was performed.

[Series 1: us breast*left* limited inc axilla · 0.03mm/px · 6 of 6 slices shown]
[im 1/6]
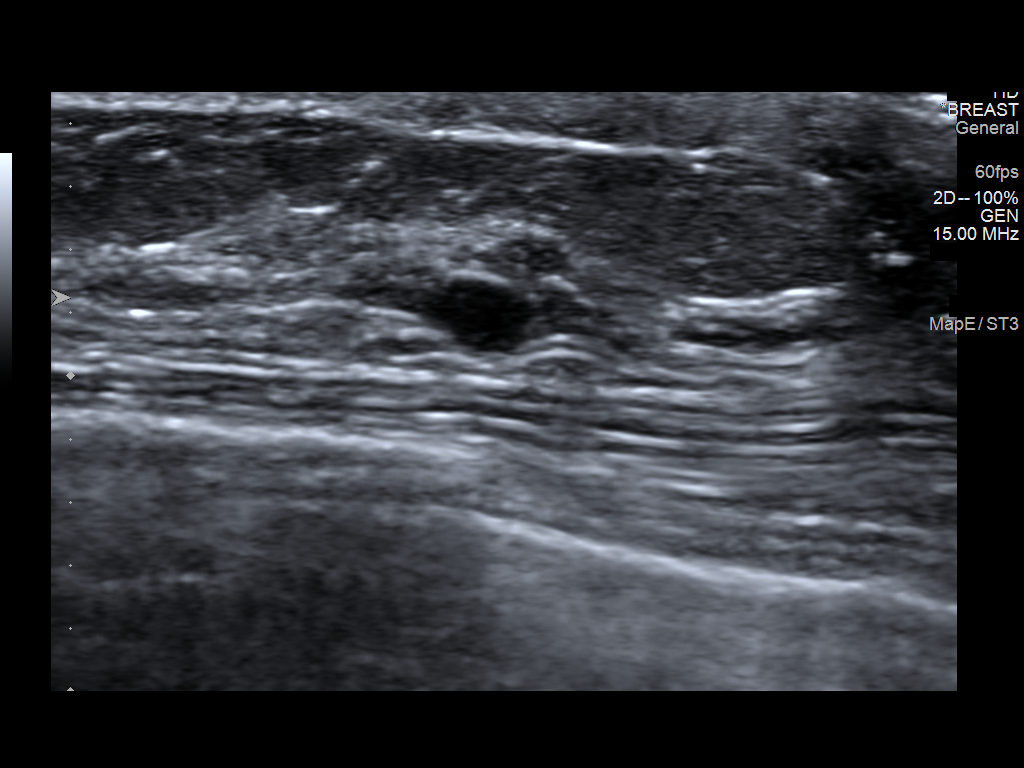
[im 2/6]
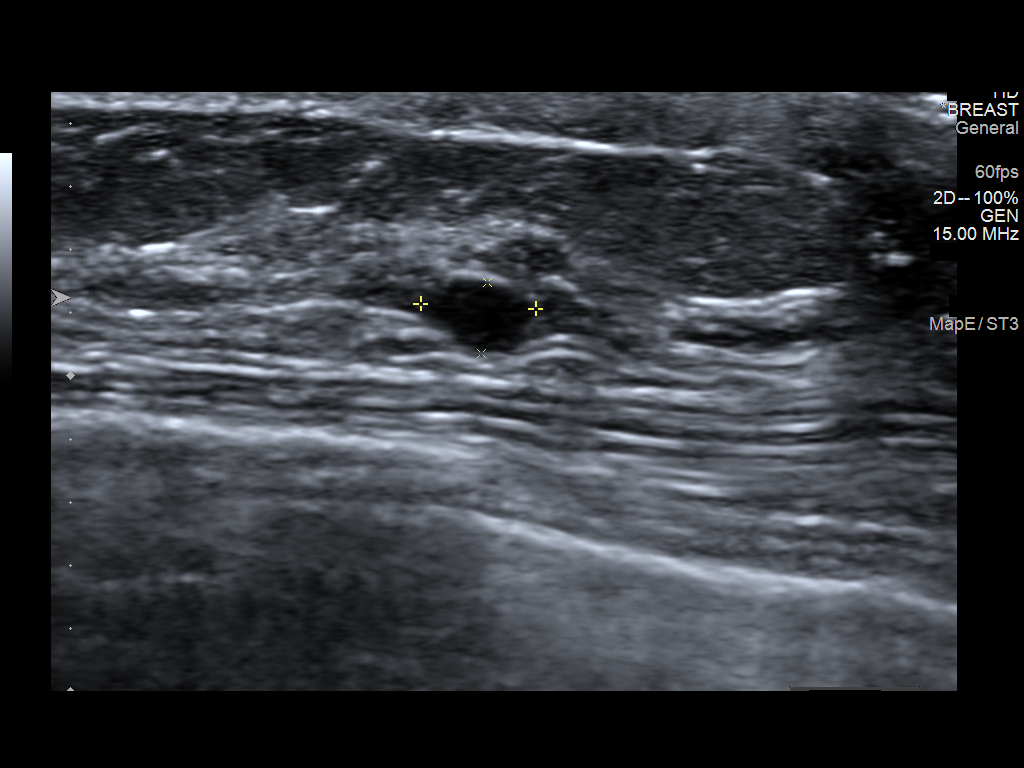
[im 3/6]
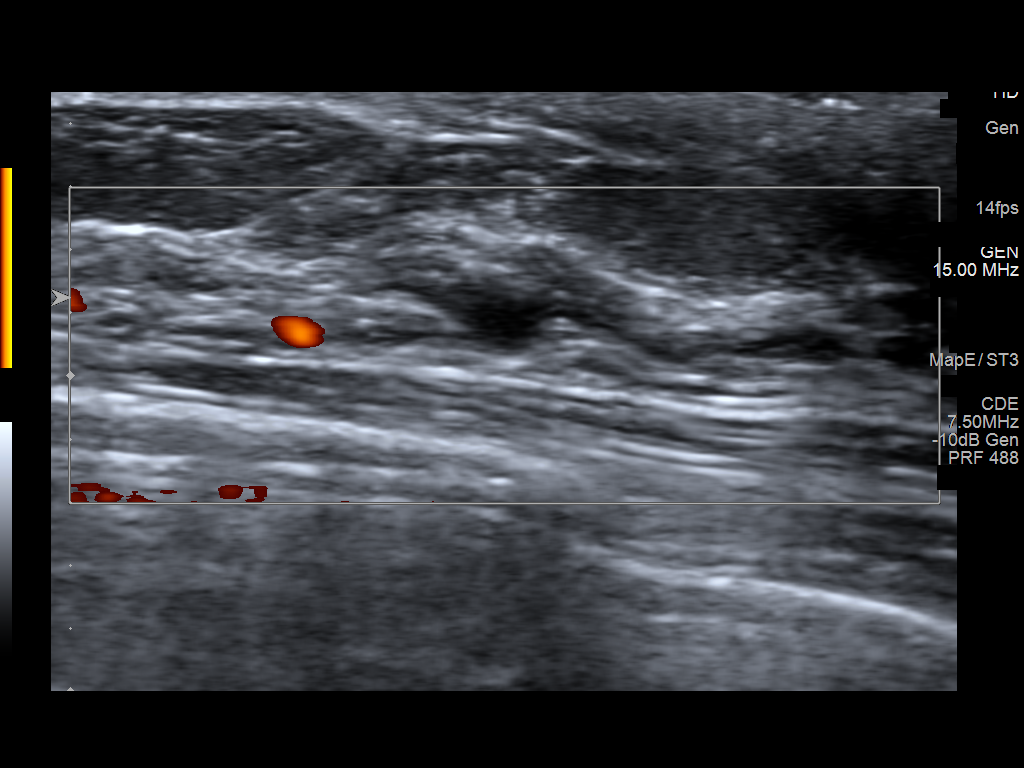
[im 4/6]
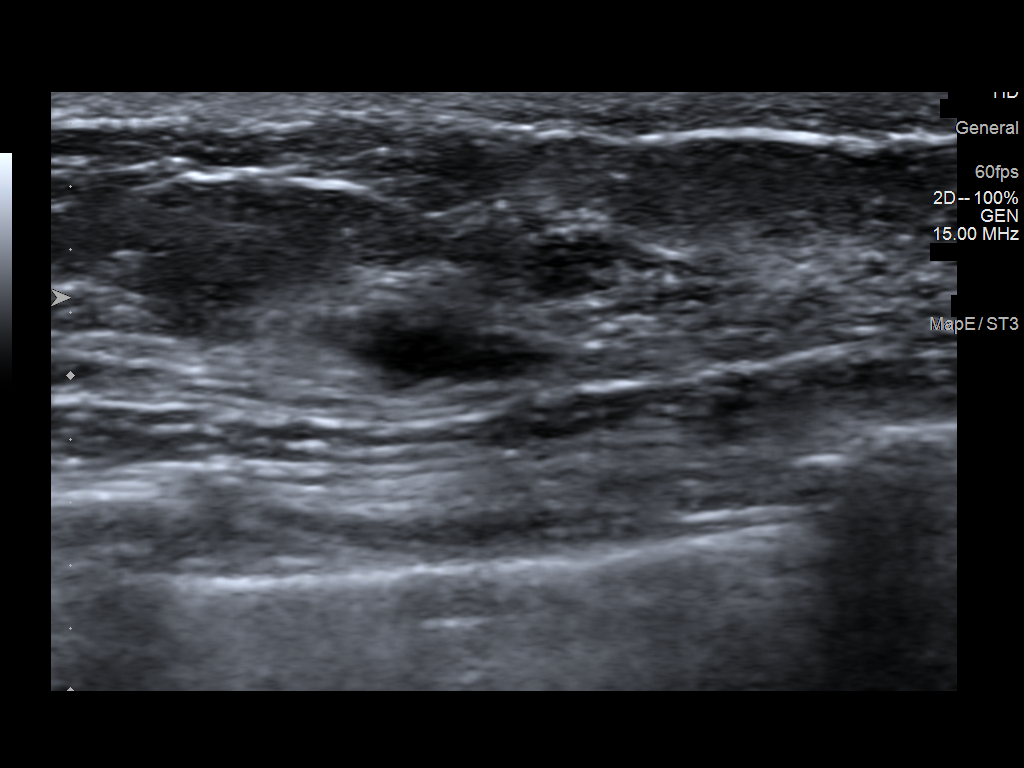
[im 5/6]
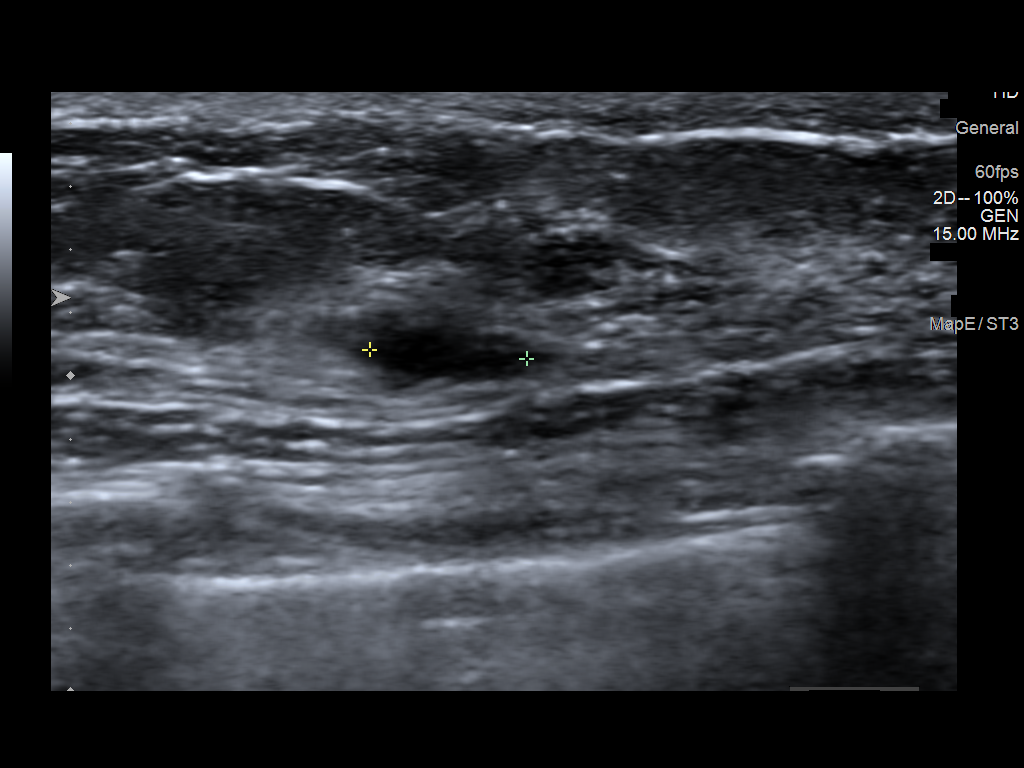
[im 6/6]
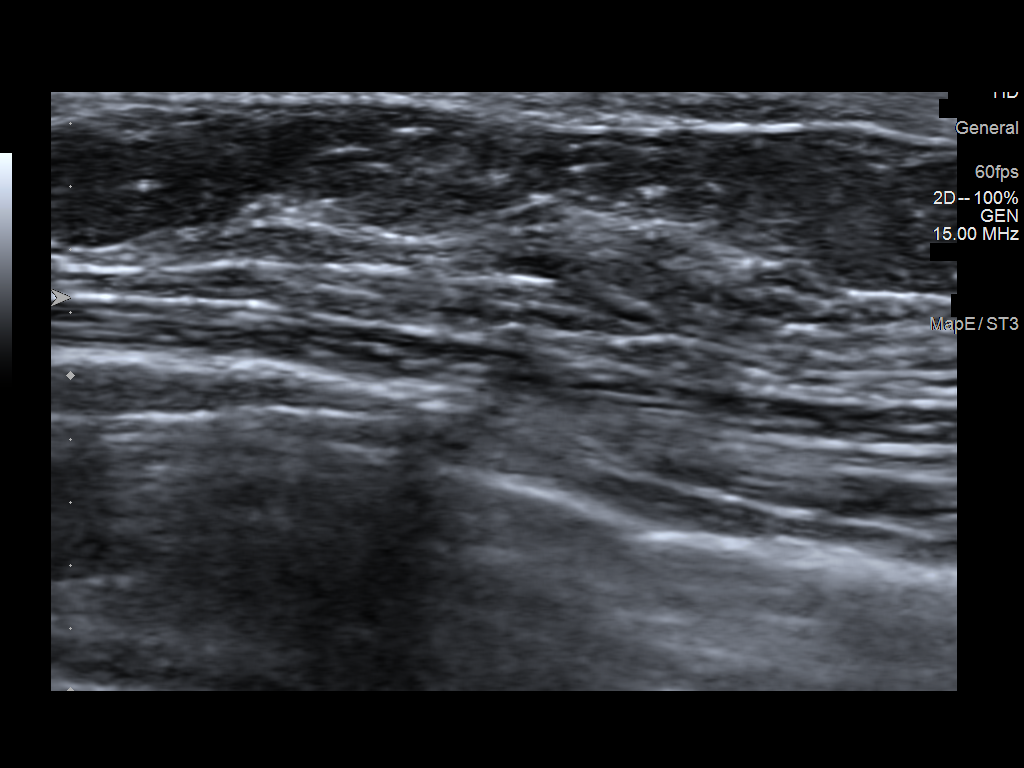

[6 of 6 positions shown; findings below may reference images not displayed]

ACR Breast Density Category b: There are scattered areas of
fibroglandular density.
FINDINGS: Spot-compression CC and MLO views of the area of concern were
obtained.

The focal asymmetry in the inner breast at anterior to middle depth
questioned on screening mammography disperses with compression,
likely indicating an island of focally dense fibroglandular tissue.

There is a circumscribed isodense mass measuring approximately 5 mm
in the slight inner retroareolar location at posterior depth which,
on the screening MLO view, is in the slight upper breast, therefore
likely at or near 9-10 o'clock location. There is no associated
architectural distortion or suspicious calcifications.

Targeted ultrasound is performed, demonstrating an oval
circumscribed parallel anechoic mass with scattered internal echoes
at the 10 o'clock position approximately 1 cm from nipple at
posterior depth, measuring approximately 5 x 2 x 4 mm, demonstrating
posterior acoustic enhancement and no internal power Doppler flow,
accounting for the mammographic mass.

Normal dense fibroglandular tissue is present at the 9 o'clock
position approximately 2 cm from the nipple, corresponding to the
mammographic asymmetry. No suspicious solid mass or abnormal
acoustic shadowing is identified.
IMPRESSION: 1. No mammographic or sonographic evidence of malignancy involving
the LEFT breast.
2. Benign 5 mm cyst in the UPPER OUTER QUADRANT at posterior depth
at 10 o'clock 1 cm from nipple.

RECOMMENDATION:
Screening mammogram in one year.(Code:[YB])

I have discussed the findings and recommendations with the patient.
If applicable, a reminder letter will be sent to the patient
regarding the next appointment.

BI-RADS CATEGORY  2: Benign.

## 2021-04-05 IMAGING — MG MM DIGITAL DIAGNOSTIC UNILAT*L* W/ TOMO W/ CAD
4 series · 4 of 12 positions shown · non-contrast
Comparison: Previous exam(s).

CLINICAL DATA: Recall from screening mammography, possible focal
asymmetry involving the inner LEFT breast at anterior to middle
depth.

EXAM:
DIGITAL DIAGNOSTIC UNILATERAL LEFT MAMMOGRAM WITH TOMOSYNTHESIS AND
CAD; ULTRASOUND LEFT BREAST LIMITED
TECHNIQUE: Left digital diagnostic mammography and breast tomosynthesis was
performed. The images were evaluated with computer-aided detection.;
Targeted ultrasound examination of the left breast was performed.

[L CC synth-2D]
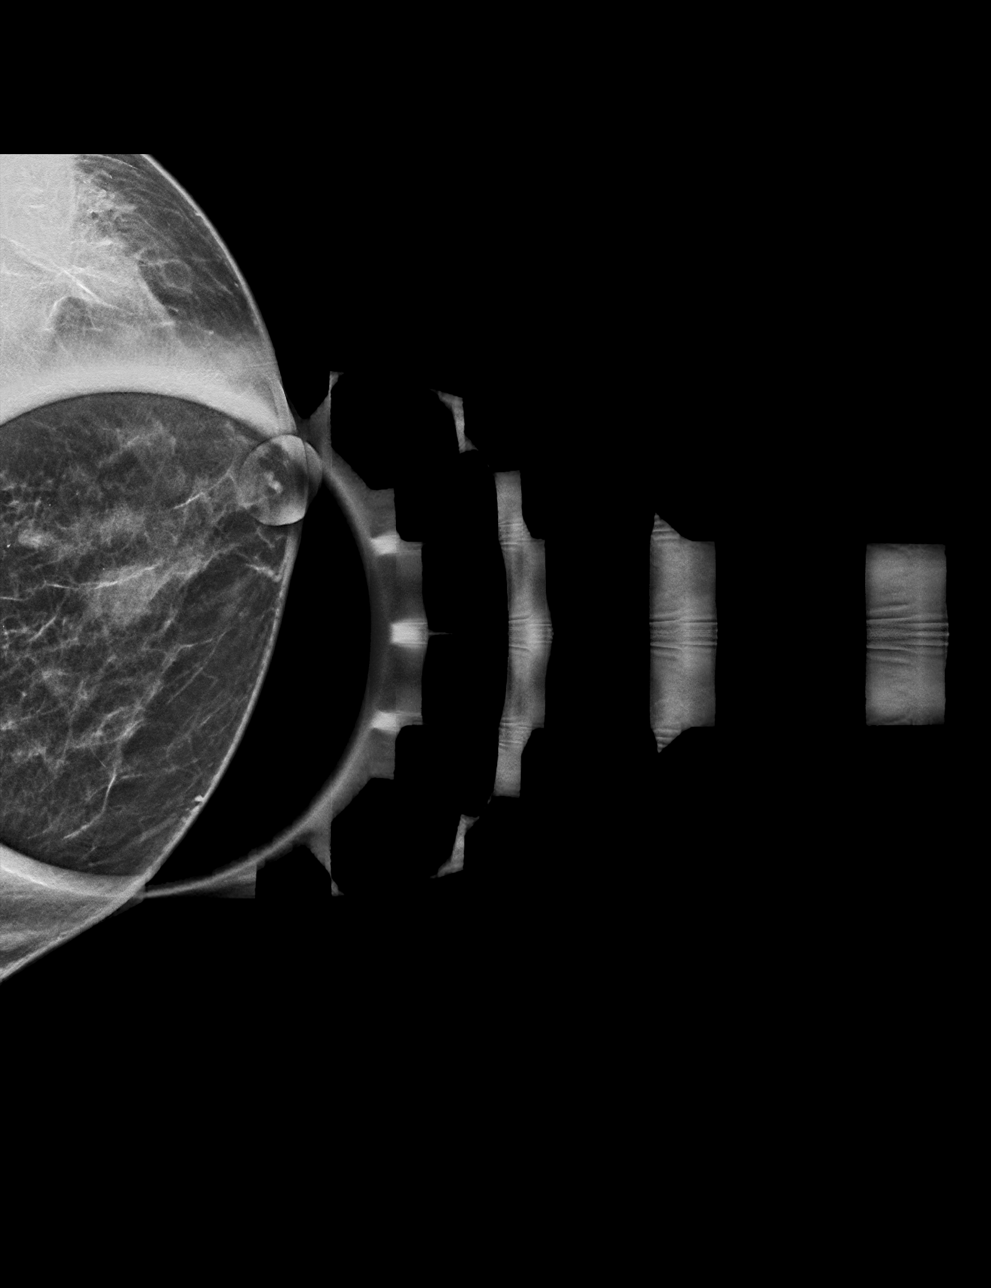

[L MLO synth-2D]
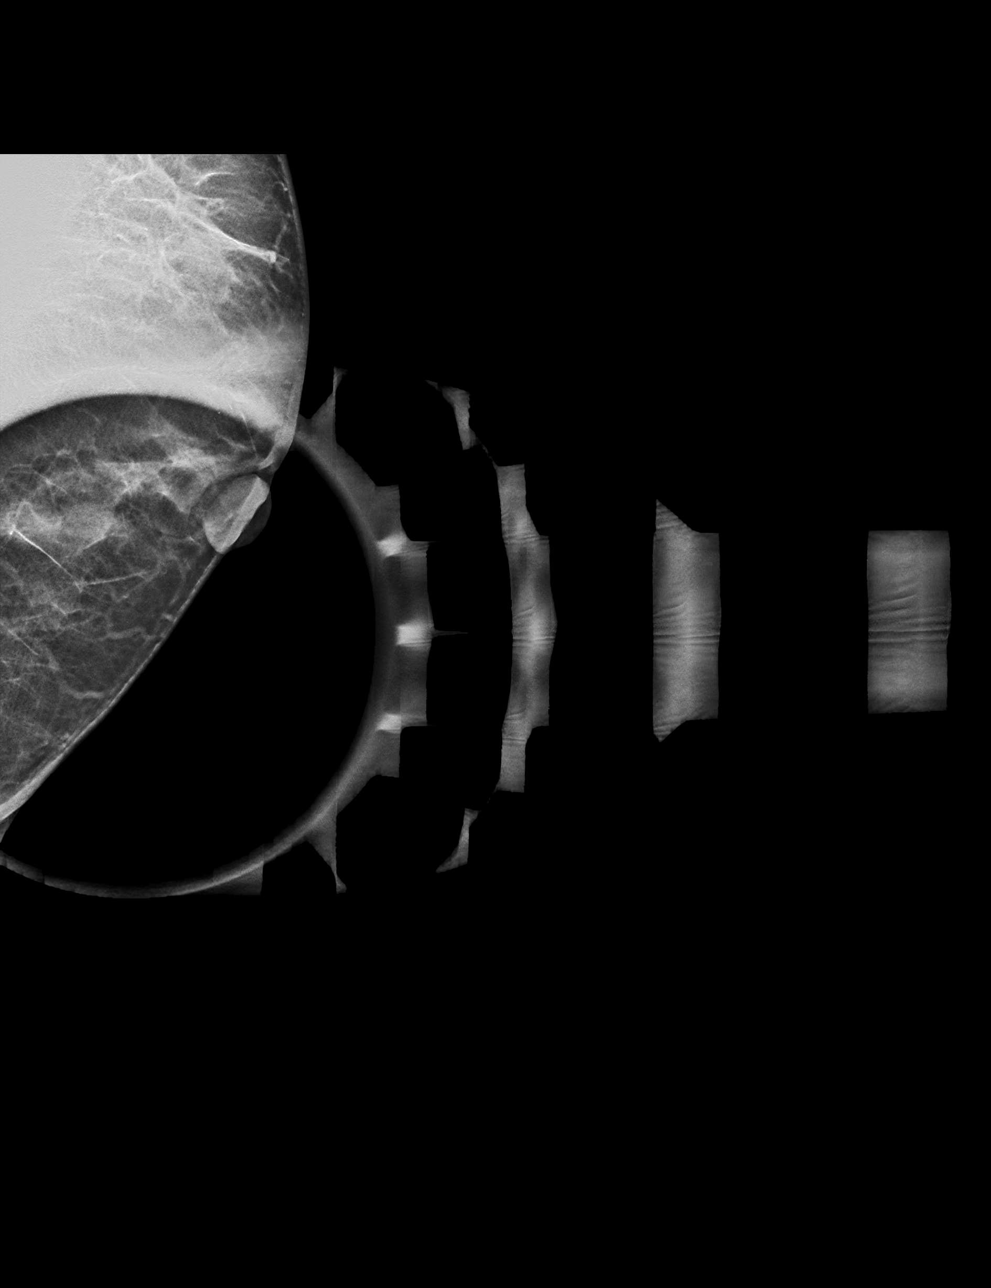

[L CC tomo · tomo slice 17/34.0]
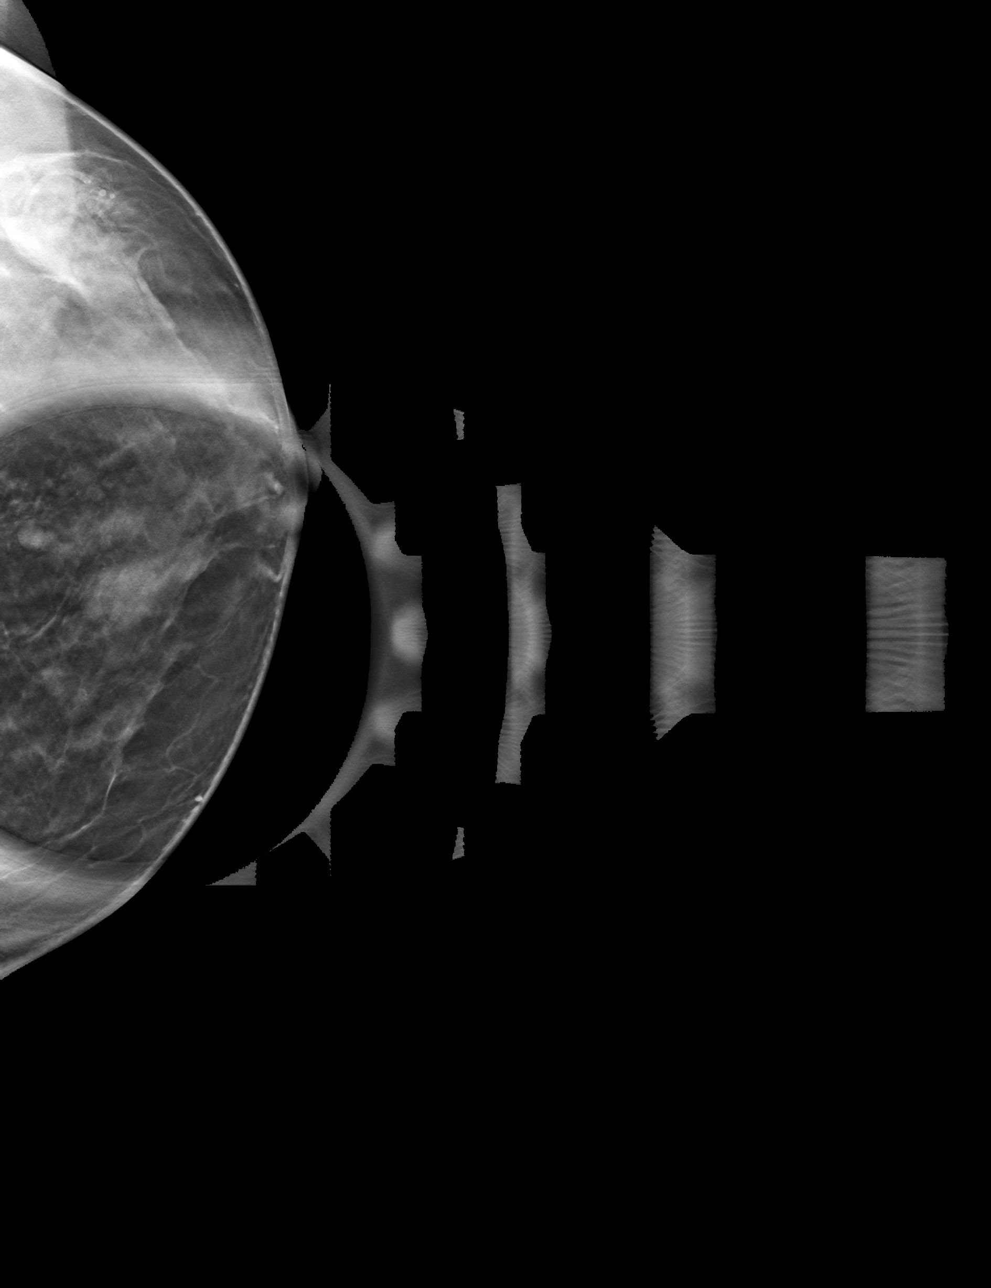

[L MLO tomo · tomo slice 17/33.0]
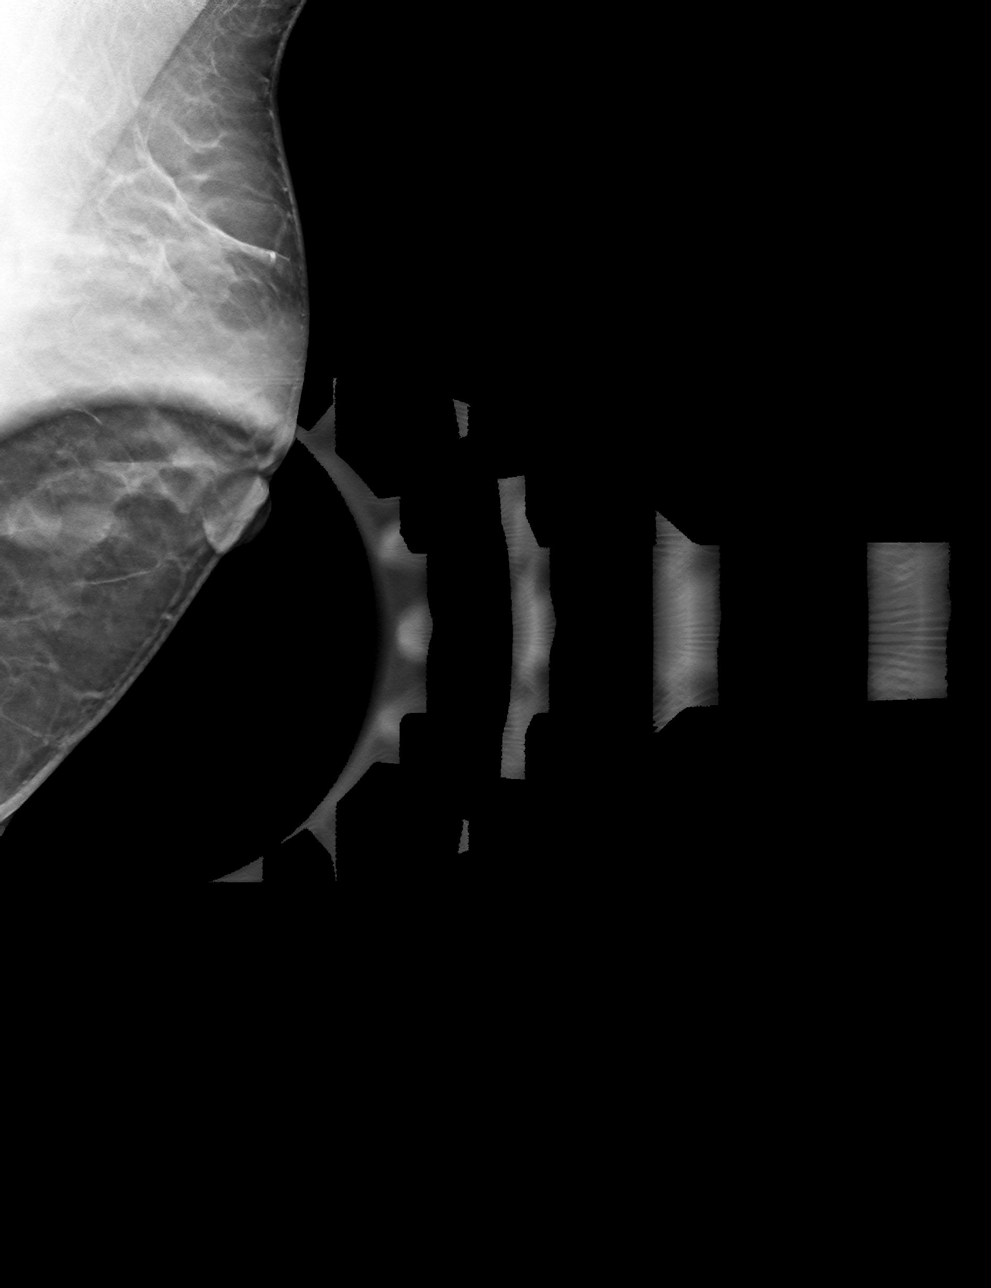

[4 of 12 positions shown; findings below may reference images not displayed]

ACR Breast Density Category b: There are scattered areas of
fibroglandular density.
FINDINGS: Spot-compression CC and MLO views of the area of concern were
obtained.

The focal asymmetry in the inner breast at anterior to middle depth
questioned on screening mammography disperses with compression,
likely indicating an island of focally dense fibroglandular tissue.

There is a circumscribed isodense mass measuring approximately 5 mm
in the slight inner retroareolar location at posterior depth which,
on the screening MLO view, is in the slight upper breast, therefore
likely at or near 9-10 o'clock location. There is no associated
architectural distortion or suspicious calcifications.

Targeted ultrasound is performed, demonstrating an oval
circumscribed parallel anechoic mass with scattered internal echoes
at the 10 o'clock position approximately 1 cm from nipple at
posterior depth, measuring approximately 5 x 2 x 4 mm, demonstrating
posterior acoustic enhancement and no internal power Doppler flow,
accounting for the mammographic mass.

Normal dense fibroglandular tissue is present at the 9 o'clock
position approximately 2 cm from the nipple, corresponding to the
mammographic asymmetry. No suspicious solid mass or abnormal
acoustic shadowing is identified.
IMPRESSION: 1. No mammographic or sonographic evidence of malignancy involving
the LEFT breast.
2. Benign 5 mm cyst in the UPPER OUTER QUADRANT at posterior depth
at 10 o'clock 1 cm from nipple.

RECOMMENDATION:
Screening mammogram in one year.(Code:[YB])

I have discussed the findings and recommendations with the patient.
If applicable, a reminder letter will be sent to the patient
regarding the next appointment.

BI-RADS CATEGORY  2: Benign.

## 2021-05-03 DIAGNOSIS — R69 Illness, unspecified: Secondary | ICD-10-CM | POA: Diagnosis not present

## 2021-05-03 DIAGNOSIS — E039 Hypothyroidism, unspecified: Secondary | ICD-10-CM | POA: Diagnosis not present

## 2021-05-03 DIAGNOSIS — Z23 Encounter for immunization: Secondary | ICD-10-CM | POA: Diagnosis not present

## 2021-05-03 DIAGNOSIS — I10 Essential (primary) hypertension: Secondary | ICD-10-CM | POA: Diagnosis not present

## 2021-05-03 DIAGNOSIS — G479 Sleep disorder, unspecified: Secondary | ICD-10-CM | POA: Diagnosis not present

## 2021-05-03 DIAGNOSIS — G4700 Insomnia, unspecified: Secondary | ICD-10-CM | POA: Diagnosis not present

## 2021-05-19 NOTE — Progress Notes (Signed)
65 y.o. [id number] Married White or Caucasian Not Hispanic or Latino female here for annual exam.  H/O hysterectomy. Sexually active, no pain.   She takes miralax daily, has normal BM's with the miralax. No bladder c/o.    Mother with breast cancer in her 50's, patient with negative BRCA testing.   Patient's last menstrual period was 11/08/2001 (approximate).          Sexually active: Yes.    The current method of family planning is status post hysterectomy.    Exercising: Yes.     Yoga and walking  Smoker:  no  Health Maintenance: Pap:  unsure History of abnormal Pap:  no MMG:  03/08/21- birads 0, 04/05/21- Diag birads 2, [place]- birads 2. Breast cysts  BMD:   11/18/19- osteopenia T-score -2.0, FRAX 7.7/0.5%  Colonoscopy: ? TDaP:  04/11/2015 Gardasil: N/a   reports that she has never smoked. She has never used smokeless tobacco. She reports that she does not drink alcohol and does not use drugs. She is a Agricultural engineer, retired from Therapist, nutritional. 5 kids, 18 grand children. One son and 4 grand children are local. One daughter is near [place]. Every one is out of state.    Past Medical History:  Diagnosis Date   BRCA negative 01/2010   BRCA I/ II negative, done secondary to mother's history of Breast Cancer at age 7   Celiac sprue 3/07   History of depression    Hypertension    Osteopenia    Thyroid disease    hypothyroid    Past Surgical History:  Procedure Laterality Date   ABDOMINAL HYSTERECTOMY     CESAREAN SECTION  1987   COLONOSCOPY  10/04/05   celiac   ESOPHAGOGASTRODUODENOSCOPY ENDOSCOPY  10/04/05   Biopsy shows celiac   FEMORAL HERNIA REPAIR Right 07/2004   VAGINAL DELIVERY     x4   VAGINAL HYSTERECTOMY  11/27/2001   secondary to adenomyosis, ovaries remain    Current Outpatient Medications  Medication Sig Dispense Refill   ALPRAZolam (XANAX) 0.25 MG tablet Take 0.25 mg by mouth at bedtime as needed for sleep.     buPROPion (WELLBUTRIN SR) 150 MG 12 hr tablet Take  150 mg by mouth daily.  0   diltiazem (CARDIZEM CD) 360 MG 24 hr capsule Take 360 mg by mouth daily.     docusate sodium (COLACE) 100 MG capsule Take 100 mg by mouth 2 (two) times daily.     Glucosamine-Chondroit-Vit C-Mn (GLUCOSAMINE CHONDR 1500 COMPLX) CAPS Take 2 capsules by mouth daily.     levothyroxine (SYNTHROID, LEVOTHROID) 50 MCG tablet Take 50 mcg by mouth daily before breakfast.     Multiple Vitamin (MULTIVITAMIN) tablet Take 1 tablet by mouth daily.     Omega-3 Fatty Acids (FISH OIL) 1000 MG CAPS Take 2,000 mg by mouth daily.     PARoxetine (PAXIL) 10 MG tablet Take 10 mg by mouth every morning.     polyethylene glycol (MIRALAX / GLYCOLAX) packet Take 17 g by mouth daily.     traZODone (DESYREL) 150 MG tablet Take 75 mg by mouth at bedtime.  0   triamterene-hydrochlorothiazide (DYAZIDE) 37.5-25 MG capsule Take 1 capsule by mouth daily. Taking 1.5 tab daily     potassium chloride SA (KLOR-CON) 20 MEQ tablet Take 1 tablet (20 mEq total) by mouth 2 (two) times daily. (Patient not taking: Reported on 05/24/2021) 10 tablet 0   No current facility-administered medications for this visit.    Family History  Problem Relation Age of Onset   Breast cancer Mother 66       second time postmenopausal   Prostate cancer Father    Chronic Renal Failure Father    Hypertension Brother    Breast cancer Maternal Aunt        postmenopausal    Review of Systems  All other systems reviewed and are negative.  Exam:   BP 140/82   Pulse 62   Ht 5' 3.5" (1.613 m)   Wt 145 lb (65.8 kg)   LMP 11/08/2001 (Approximate)   SpO2 98%   BMI 25.28 kg/m   Weight change: '@WEIGHTCHANGE' @ Height:   Height: 5' 3.5" (161.3 cm)  Ht Readings from Last 3 Encounters:  05/24/21 5' 3.5" (1.613 m)  10/12/20 5' 4.25" (1.632 m)  09/10/20 5' 4.25" (1.632 m)    General appearance: alert, cooperative and appears stated age Head: Normocephalic, without obvious abnormality, atraumatic Neck: no adenopathy, supple,  symmetrical, trachea midline and thyroid normal to inspection and palpation Lungs: clear to auscultation bilaterally Cardiovascular: regular rate and rhythm Breasts:  stable lumps in the periphery of the left breast at 7-8 o'clock, 2 smooth, mobile, adjoining lumps, not tender. No other lumps.  Abdomen: soft, non-tender; non distended,  no masses,  no organomegaly Extremities: extremities normal, atraumatic, no cyanosis or edema Skin: Skin color, texture, turgor normal. No rashes or lesions Lymph nodes: Cervical, supraclavicular, and axillary nodes normal. No abnormal inguinal nodes palpated Neurologic: Grossly normal   Pelvic: External genitalia:  no lesions              Urethra:  normal appearing urethra with no masses, tenderness or lesions              Bartholins and Skenes: normal                 Vagina: normal appearing vagina with normal color and discharge, no lesions              Cervix: absent               Bimanual Exam:  Uterus:  uterus absent              Adnexa: no mass, fullness, tenderness               Rectovaginal: Confirms               Anus:  normal sphincter tone, no lesions  Gae Dry chaperoned for the exam.  1. Encounter for gynecological examination without abnormal finding No pap needed She had a partial colonoscopy this August, can't get cleaned out. She will do the cologuard Mammogram UTD Discussed breast self awareness Labs with primary F/u breast and pelvic in 2 years  2. Osteopenia, unspecified location Discussed calcium and vit D She is exercising - DG Bone Density; Future, ordered for May, 2023  3. Thyroid lump On Synthroid - US SOFT TISSUE HEAD & NECK (NON-THYROID); Future

## 2021-05-24 ENCOUNTER — Ambulatory Visit (INDEPENDENT_AMBULATORY_CARE_PROVIDER_SITE_OTHER): Payer: Medicare HMO | Admitting: Obstetrics and Gynecology

## 2021-05-24 ENCOUNTER — Other Ambulatory Visit: Payer: Self-pay

## 2021-05-24 ENCOUNTER — Ambulatory Visit: Payer: Medicare Other | Admitting: Obstetrics and Gynecology

## 2021-05-24 ENCOUNTER — Encounter: Payer: Self-pay | Admitting: Obstetrics and Gynecology

## 2021-05-24 VITALS — BP 140/82 | HR 62 | Ht 63.5 in | Wt 145.0 lb

## 2021-05-24 DIAGNOSIS — Z01419 Encounter for gynecological examination (general) (routine) without abnormal findings: Secondary | ICD-10-CM | POA: Diagnosis not present

## 2021-05-24 DIAGNOSIS — E079 Disorder of thyroid, unspecified: Secondary | ICD-10-CM | POA: Diagnosis not present

## 2021-05-24 DIAGNOSIS — M8580 Other specified disorders of bone density and structure, unspecified site: Secondary | ICD-10-CM | POA: Diagnosis not present

## 2021-05-24 NOTE — Patient Instructions (Signed)

## 2021-05-25 ENCOUNTER — Telehealth: Payer: Self-pay | Admitting: *Deleted

## 2021-05-25 DIAGNOSIS — E079 Disorder of thyroid, unspecified: Secondary | ICD-10-CM

## 2021-05-25 NOTE — Addendum Note (Signed)
Addended by: Lorine Bears on: 05/25/2021 09:14 AM   Modules accepted: Orders

## 2021-05-25 NOTE — Telephone Encounter (Signed)
Thyroid U/S scheduled  05/27/2021 at 1:30 arrive  Location  [facility] Address: [address], [place], [place] [address] Phone: [phone]  I left a message for pt to return our call to rely thryoid u/s appointment and provide number if she needs to r/s appointment.

## 2021-05-25 NOTE — Telephone Encounter (Signed)
-----   Message from Salvadore Dom, MD sent at 05/24/2021  2:46 PM EST ----- Thyroid ultrasound has been ordered.

## 2021-05-26 NOTE — Telephone Encounter (Signed)
Pt aware of appt.

## 2021-05-27 ENCOUNTER — Other Ambulatory Visit: Payer: Self-pay | Admitting: Obstetrics and Gynecology

## 2021-05-27 ENCOUNTER — Encounter: Payer: Self-pay | Admitting: Obstetrics and Gynecology

## 2021-05-27 ENCOUNTER — Ambulatory Visit
Admission: RE | Admit: 2021-05-27 | Discharge: 2021-05-27 | Disposition: A | Discharge status: Home / Self Care | Payer: Medicare HMO | Source: Ambulatory Visit | Attending: Obstetrics and Gynecology | Admitting: Obstetrics and Gynecology

## 2021-05-27 DIAGNOSIS — E079 Disorder of thyroid, unspecified: Secondary | ICD-10-CM

## 2021-05-27 DIAGNOSIS — Z1211 Encounter for screening for malignant neoplasm of colon: Secondary | ICD-10-CM | POA: Diagnosis not present

## 2021-05-27 DIAGNOSIS — Z1212 Encounter for screening for malignant neoplasm of rectum: Secondary | ICD-10-CM | POA: Diagnosis not present

## 2021-05-27 DIAGNOSIS — E042 Nontoxic multinodular goiter: Secondary | ICD-10-CM | POA: Diagnosis not present

## 2021-05-27 HISTORY — DX: Nontoxic multinodular goiter: E04.2

## 2021-05-27 IMAGING — US US THYROID
1 series · 13 of 25 positions shown · non-contrast
Comparison: None.

CLINICAL DATA: Palpable abnormality.

EXAM:
THYROID ULTRASOUND
TECHNIQUE: Ultrasound examination of the thyroid gland and adjacent soft
tissues was performed.

[Series 1: us thyroid · 0.04mm/px · 13 of 48 slices shown]
[im 1/48]
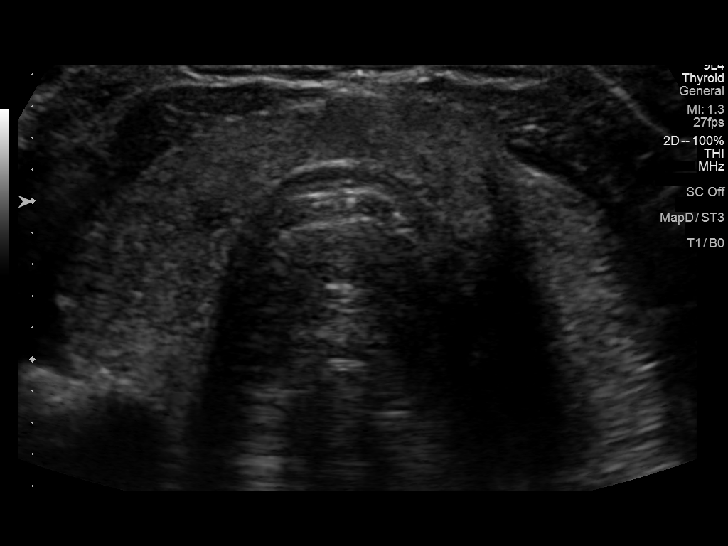
[im 4/48]
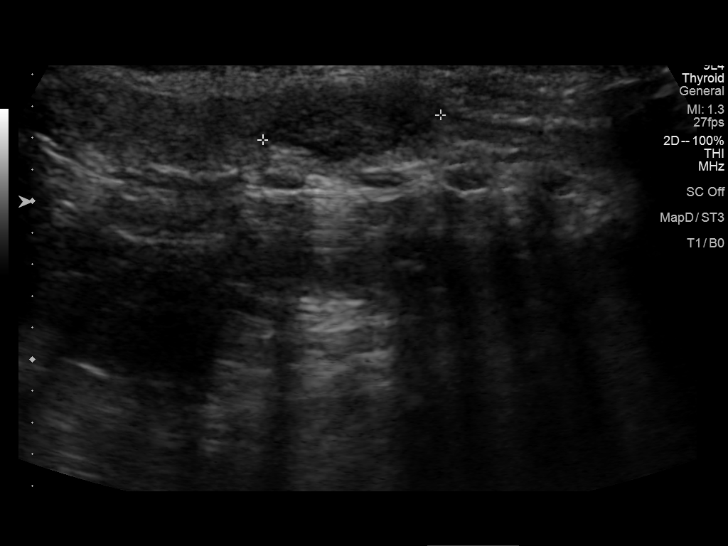
[im 8/48]
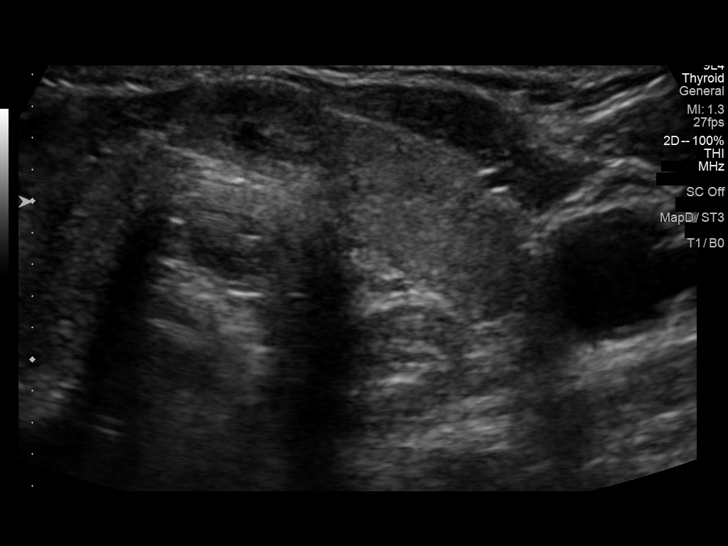
[im 12/48]
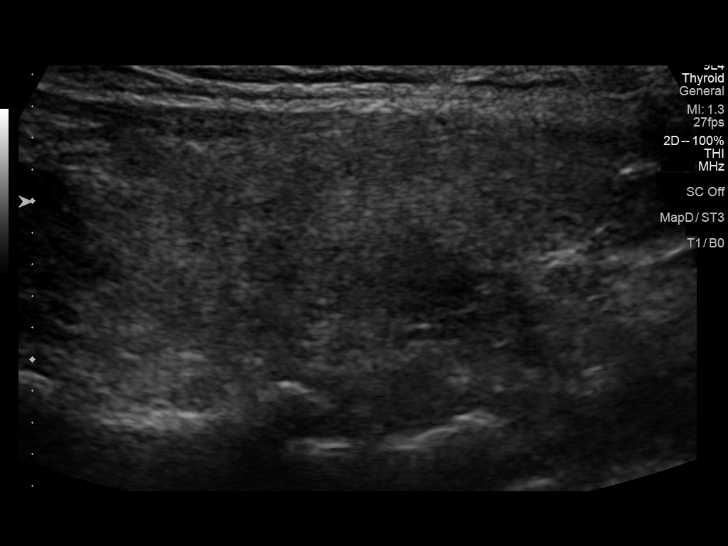
[im 16/48]
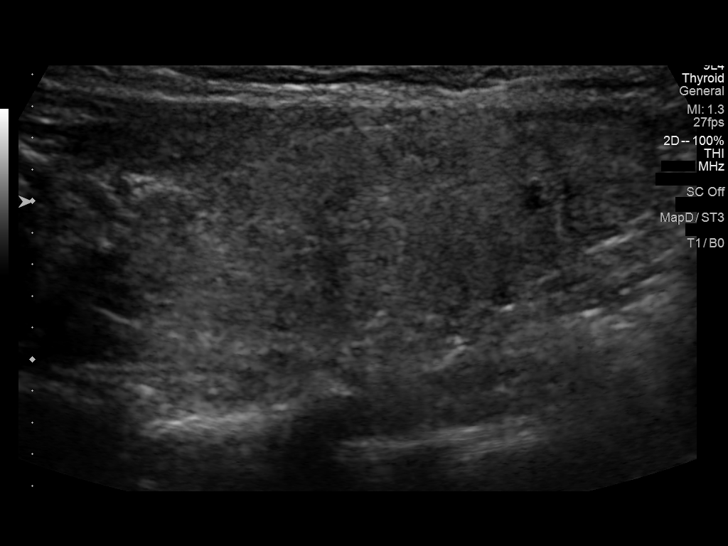
[im 20/48]
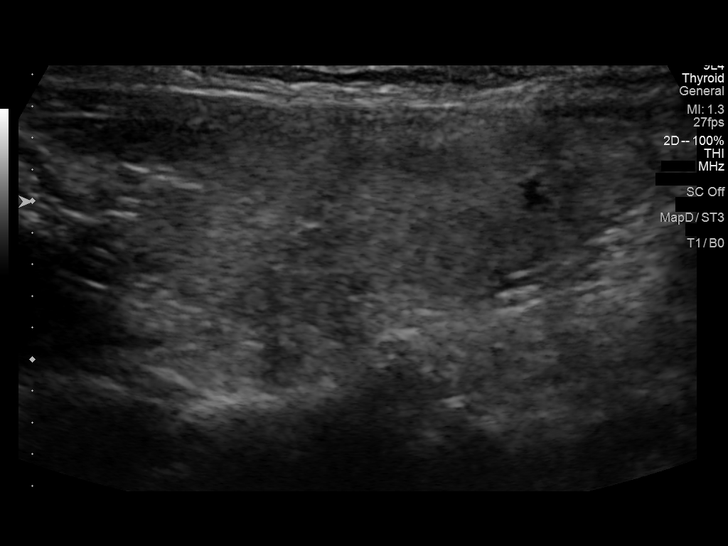
[im 24/48]
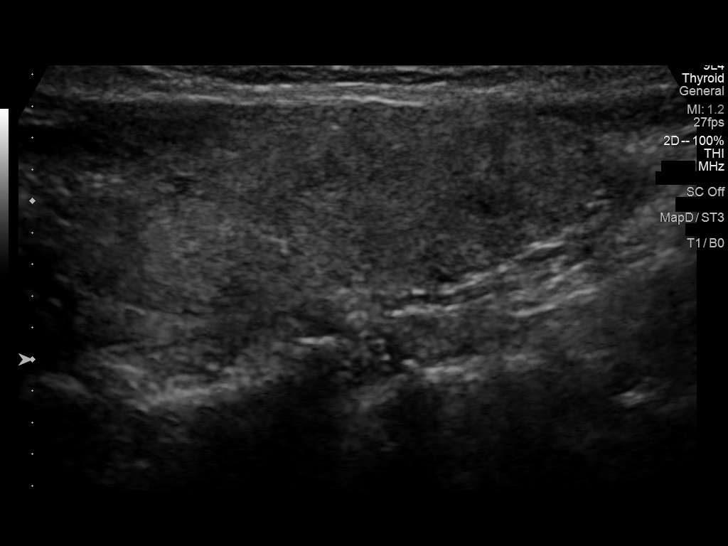
[im 28/48]
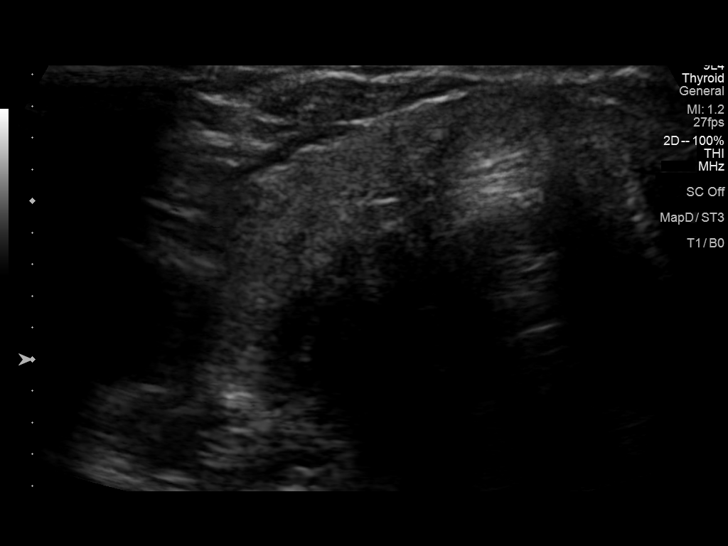
[im 32/48]
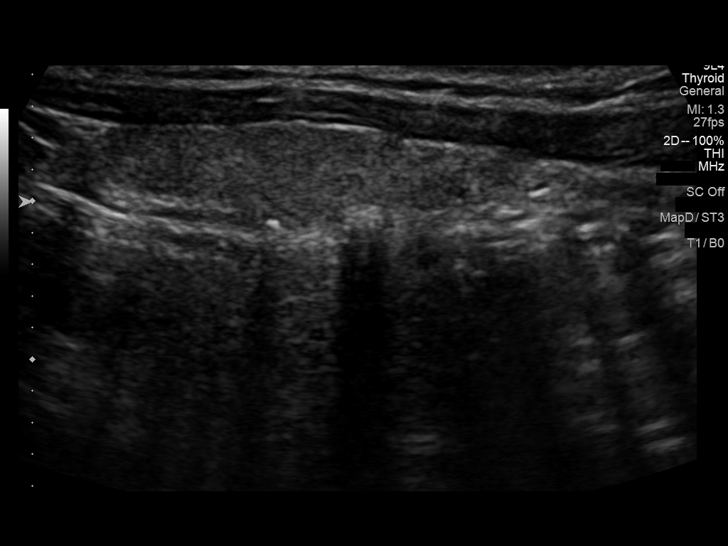
[im 36/48]
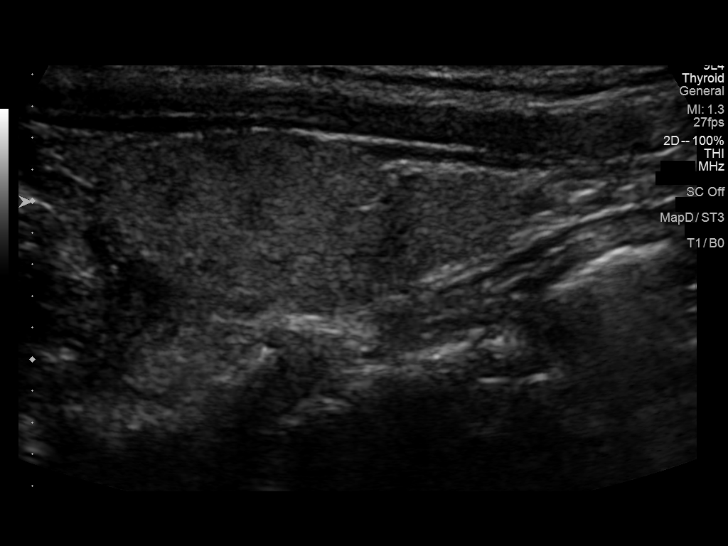
[im 40/48]
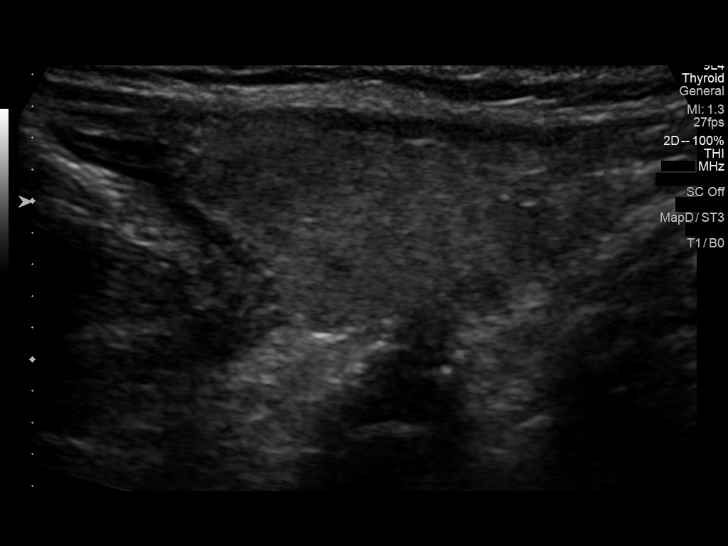
[im 44/48]
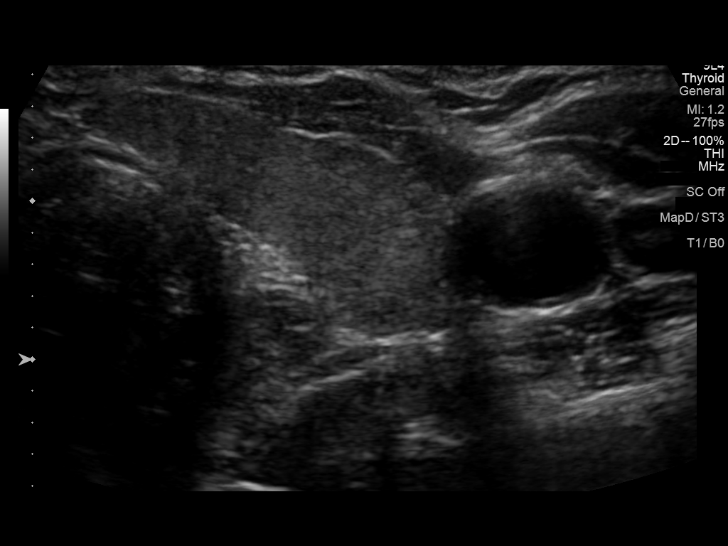
[im 48/48]
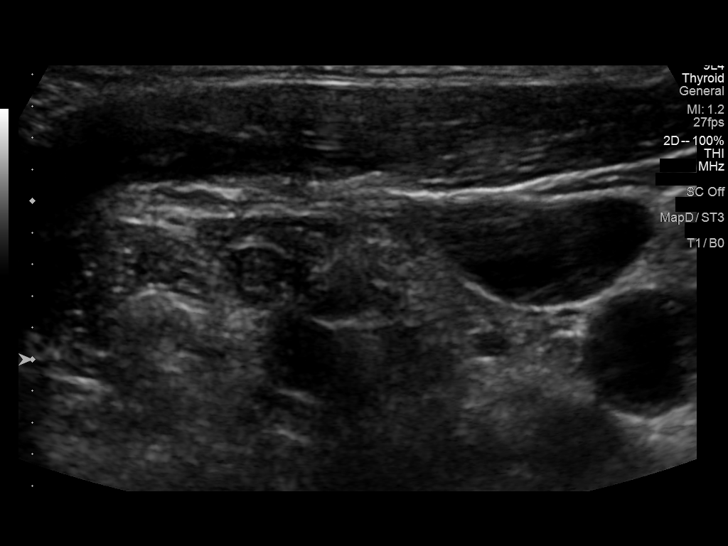

[13 of 25 positions shown; findings below may reference images not displayed]

FINDINGS: Parenchymal Echotexture: Moderately heterogenous

Isthmus: 0.4 cm

Right lobe: 3.5 x 1.6 x 1.3 cm

Left lobe: 3.2 x 1.0 x 1.4 cm

_________________________________________________________

Estimated total number of nodules >/= 1 cm: 2

Number of spongiform nodules >/=  2 cm not described below (TR1): 0

Number of mixed cystic and solid nodules >/= 1.5 cm not described
below (TR2): 0

_________________________________________________________

Nodule 1 refers to a solid hypoechoic nodule (TR 4) in the thyroid
isthmus which measures up to 1.1 cm. *Given size (>/= 1 - 1.4 cm)
and appearance, a follow-up ultrasound in 1 year should be
considered based on TI-RADS criteria.

Nodule 2 is a small subcentimeter solid hypoechoic nodule (TR 4) in
the right thyroid lobe. Given size (<0.9 cm) and appearance, this
nodule does NOT meet TI-RADS criteria for biopsy or dedicated
follow-up.

Nodule 3 is a solid isoechoic nodule (TR 3) in the left thyroid lobe
measuring up to 1.2 cm. Given size (<1.4 cm) and appearance, this
nodule does NOT meet TI-RADS criteria for biopsy or dedicated
follow-up.
IMPRESSION: 1. Multinodular thyroid gland.
2. Nodule labeled 1 meets criteria for follow-up ultrasound in 1
year.

The above is in keeping with the ACR TI-RADS recommendations - [HOSPITAL] [PD];[DATE].

## 2021-05-28 ENCOUNTER — Other Ambulatory Visit: Payer: Self-pay | Admitting: *Deleted

## 2021-05-31 ENCOUNTER — Other Ambulatory Visit: Payer: Medicare HMO

## 2021-05-31 ENCOUNTER — Other Ambulatory Visit: Payer: Self-pay

## 2021-05-31 DIAGNOSIS — E042 Nontoxic multinodular goiter: Secondary | ICD-10-CM | POA: Diagnosis not present

## 2021-06-01 LAB — THYROID PANEL WITH TSH
Free Thyroxine Index: 2.5 (ref 1.4–3.8)
T3 Uptake: 29 % (ref 22–35)
T4, Total: 8.5 ug/dL (ref 5.1–11.9)
TSH: 2.8 mIU/L (ref 0.40–4.50)

## 2021-07-28 DIAGNOSIS — E039 Hypothyroidism, unspecified: Secondary | ICD-10-CM | POA: Diagnosis not present

## 2021-07-28 DIAGNOSIS — N183 Chronic kidney disease, stage 3 unspecified: Secondary | ICD-10-CM | POA: Diagnosis not present

## 2021-07-28 DIAGNOSIS — M8580 Other specified disorders of bone density and structure, unspecified site: Secondary | ICD-10-CM | POA: Diagnosis not present

## 2021-07-28 DIAGNOSIS — I10 Essential (primary) hypertension: Secondary | ICD-10-CM | POA: Diagnosis not present

## 2021-07-28 DIAGNOSIS — E7800 Pure hypercholesterolemia, unspecified: Secondary | ICD-10-CM | POA: Diagnosis not present

## 2021-07-28 DIAGNOSIS — R69 Illness, unspecified: Secondary | ICD-10-CM | POA: Diagnosis not present

## 2021-07-28 DIAGNOSIS — G4700 Insomnia, unspecified: Secondary | ICD-10-CM | POA: Diagnosis not present

## 2021-08-02 DIAGNOSIS — L814 Other melanin hyperpigmentation: Secondary | ICD-10-CM | POA: Diagnosis not present

## 2021-08-02 DIAGNOSIS — Z85828 Personal history of other malignant neoplasm of skin: Secondary | ICD-10-CM | POA: Diagnosis not present

## 2021-08-02 DIAGNOSIS — D2272 Melanocytic nevi of left lower limb, including hip: Secondary | ICD-10-CM | POA: Diagnosis not present

## 2021-08-02 DIAGNOSIS — D225 Melanocytic nevi of trunk: Secondary | ICD-10-CM | POA: Diagnosis not present

## 2021-08-02 DIAGNOSIS — D239 Other benign neoplasm of skin, unspecified: Secondary | ICD-10-CM | POA: Diagnosis not present

## 2021-08-02 DIAGNOSIS — L570 Actinic keratosis: Secondary | ICD-10-CM | POA: Diagnosis not present

## 2021-08-02 DIAGNOSIS — L821 Other seborrheic keratosis: Secondary | ICD-10-CM | POA: Diagnosis not present

## 2021-08-02 DIAGNOSIS — L578 Other skin changes due to chronic exposure to nonionizing radiation: Secondary | ICD-10-CM | POA: Diagnosis not present

## 2021-08-02 DIAGNOSIS — Z23 Encounter for immunization: Secondary | ICD-10-CM | POA: Diagnosis not present

## 2021-09-27 DIAGNOSIS — G479 Sleep disorder, unspecified: Secondary | ICD-10-CM | POA: Diagnosis not present

## 2021-09-27 DIAGNOSIS — E7800 Pure hypercholesterolemia, unspecified: Secondary | ICD-10-CM | POA: Diagnosis not present

## 2021-09-27 DIAGNOSIS — N183 Chronic kidney disease, stage 3 unspecified: Secondary | ICD-10-CM | POA: Diagnosis not present

## 2021-09-27 DIAGNOSIS — R69 Illness, unspecified: Secondary | ICD-10-CM | POA: Diagnosis not present

## 2021-09-27 DIAGNOSIS — Z0000 Encounter for general adult medical examination without abnormal findings: Secondary | ICD-10-CM | POA: Diagnosis not present

## 2021-09-27 DIAGNOSIS — I10 Essential (primary) hypertension: Secondary | ICD-10-CM | POA: Diagnosis not present

## 2021-09-27 DIAGNOSIS — E039 Hypothyroidism, unspecified: Secondary | ICD-10-CM | POA: Diagnosis not present

## 2021-09-28 ENCOUNTER — Other Ambulatory Visit: Payer: Self-pay | Admitting: Family Medicine

## 2021-09-28 DIAGNOSIS — E7800 Pure hypercholesterolemia, unspecified: Secondary | ICD-10-CM

## 2021-09-30 DIAGNOSIS — E039 Hypothyroidism, unspecified: Secondary | ICD-10-CM | POA: Diagnosis not present

## 2021-09-30 DIAGNOSIS — I951 Orthostatic hypotension: Secondary | ICD-10-CM | POA: Diagnosis not present

## 2021-09-30 DIAGNOSIS — Z008 Encounter for other general examination: Secondary | ICD-10-CM | POA: Diagnosis not present

## 2021-09-30 DIAGNOSIS — K900 Celiac disease: Secondary | ICD-10-CM | POA: Diagnosis not present

## 2021-09-30 DIAGNOSIS — I700 Atherosclerosis of aorta: Secondary | ICD-10-CM | POA: Diagnosis not present

## 2021-09-30 DIAGNOSIS — E785 Hyperlipidemia, unspecified: Secondary | ICD-10-CM | POA: Diagnosis not present

## 2021-09-30 DIAGNOSIS — M8580 Other specified disorders of bone density and structure, unspecified site: Secondary | ICD-10-CM | POA: Diagnosis not present

## 2021-09-30 DIAGNOSIS — K5900 Constipation, unspecified: Secondary | ICD-10-CM | POA: Diagnosis not present

## 2021-09-30 DIAGNOSIS — R69 Illness, unspecified: Secondary | ICD-10-CM | POA: Diagnosis not present

## 2021-09-30 DIAGNOSIS — G4700 Insomnia, unspecified: Secondary | ICD-10-CM | POA: Diagnosis not present

## 2021-09-30 DIAGNOSIS — I10 Essential (primary) hypertension: Secondary | ICD-10-CM | POA: Diagnosis not present

## 2021-09-30 DIAGNOSIS — Z803 Family history of malignant neoplasm of breast: Secondary | ICD-10-CM | POA: Diagnosis not present

## 2021-10-08 ENCOUNTER — Other Ambulatory Visit: Payer: Self-pay | Admitting: Gastroenterology

## 2021-10-08 DIAGNOSIS — K8689 Other specified diseases of pancreas: Secondary | ICD-10-CM

## 2021-11-04 ENCOUNTER — Ambulatory Visit
Admission: RE | Admit: 2021-11-04 | Discharge: 2021-11-04 | Disposition: A | Discharge status: Home / Self Care | Payer: No Typology Code available for payment source | Source: Ambulatory Visit | Attending: Family Medicine | Admitting: Family Medicine

## 2021-11-04 DIAGNOSIS — I700 Atherosclerosis of aorta: Secondary | ICD-10-CM | POA: Diagnosis not present

## 2021-11-04 DIAGNOSIS — E7800 Pure hypercholesterolemia, unspecified: Secondary | ICD-10-CM

## 2021-11-04 IMAGING — CT CT CARDIAC CORONARY ARTERY CALCIUM SCORE
3 series · 12 of 20 positions shown, 14 images · non-contrast
Comparison: None.

CLINICAL DATA: 65-year-old white female with high cholesterol.

EXAM:
CT CARDIAC CORONARY ARTERY CALCIUM SCORE
TECHNIQUE: Non-contrast imaging through the heart was performed using
prospective ECG gating. Image post processing was performed on an
independent workstation, allowing for quantitative analysis of the
heart and coronary arteries. Note that this exam targets the heart
and the chest was not imaged in its entirety.

[Series 2: calcium scoring 2.00 qr36 bestdiast 60% hrt calciu · axial · 0.43mm/px · z∈[+1447,+1475]mm · 2 of 70 slices shown]
[im 14/70  vessel]
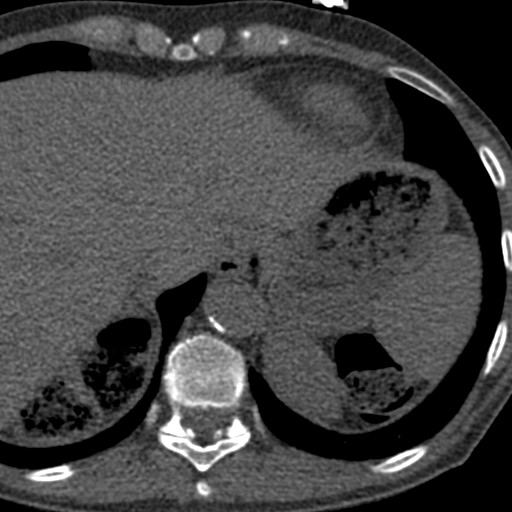
[im 28/70  vessel]
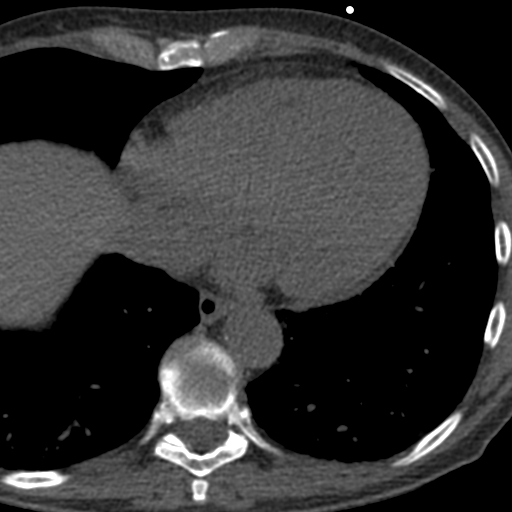

[Series 3: calcium scoring 2.00 br40 bestdiast 60% axial · axial · 0.55mm/px · z∈[+1450,+1538]mm · 5 of 67 slices shown, 7 images]
[im 12/67  vessel]
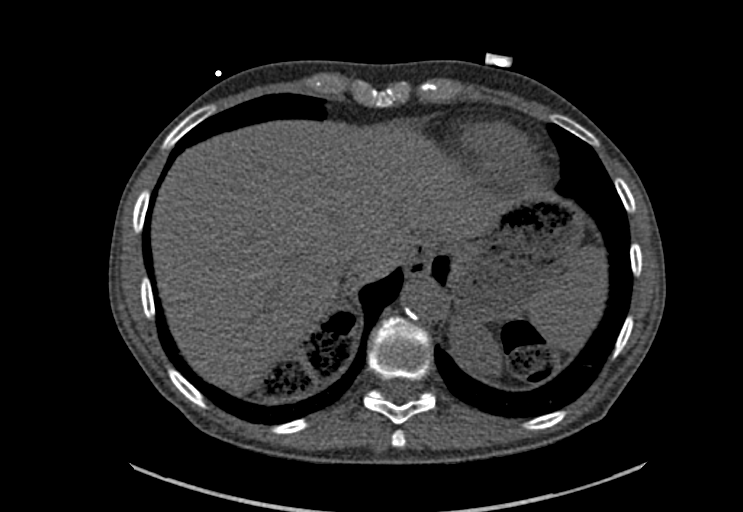
[im 12/67  lung]
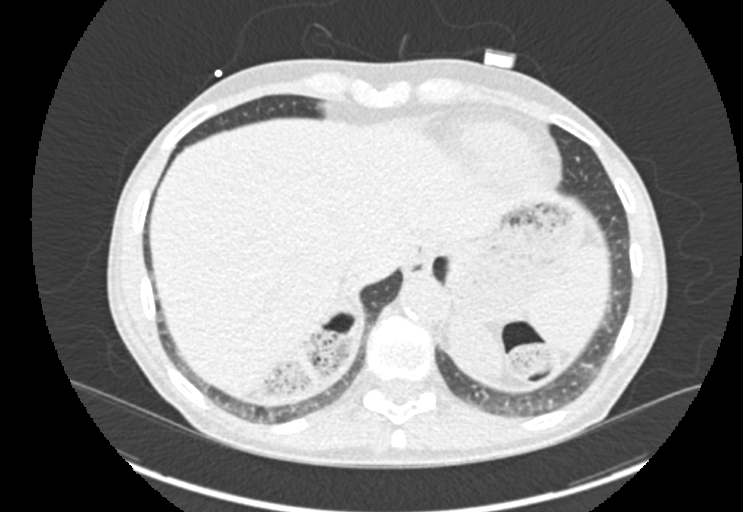
[im 23/67  vessel]
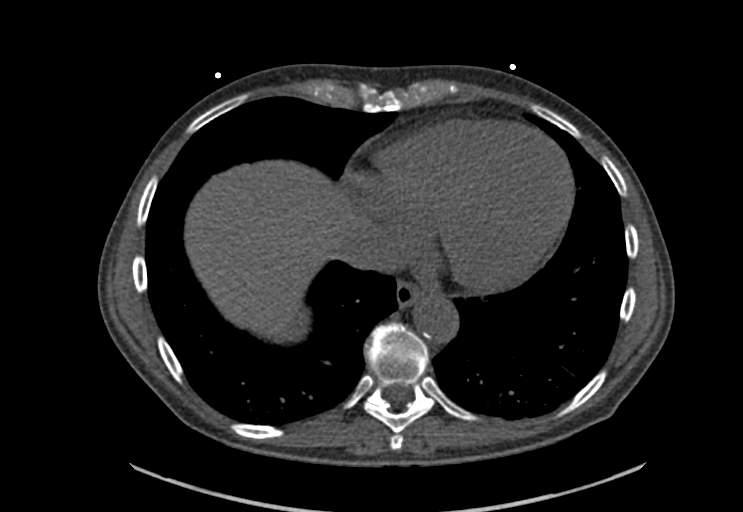
[im 34/67  vessel]
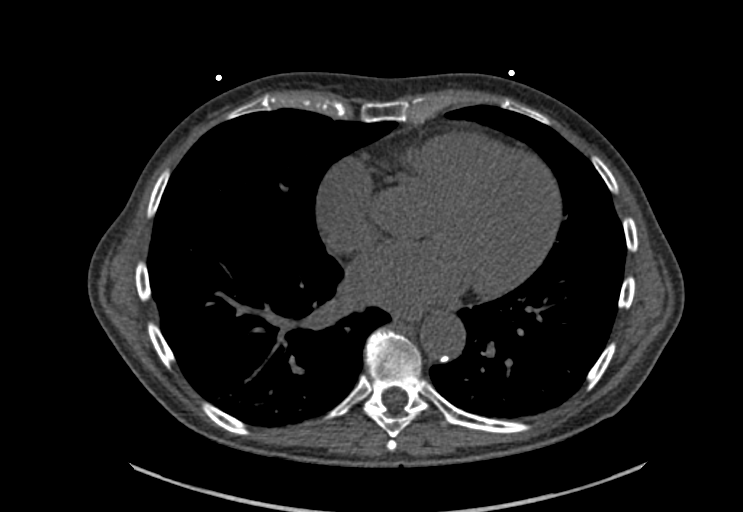
[im 45/67  vessel]
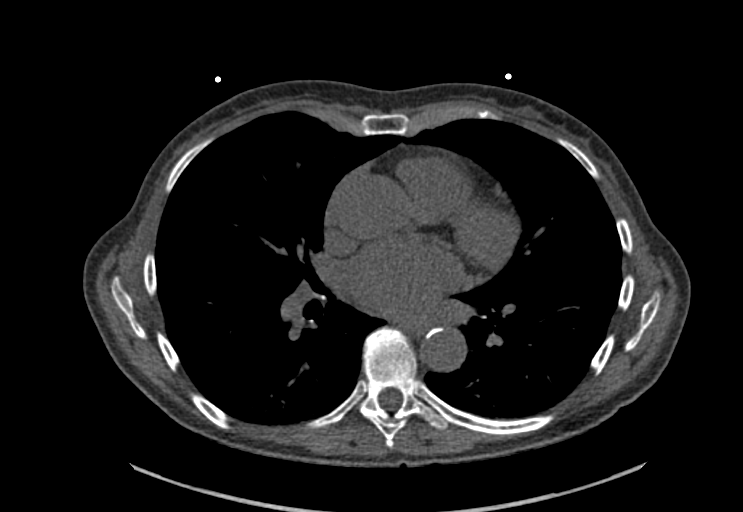
[im 56/67  vessel]
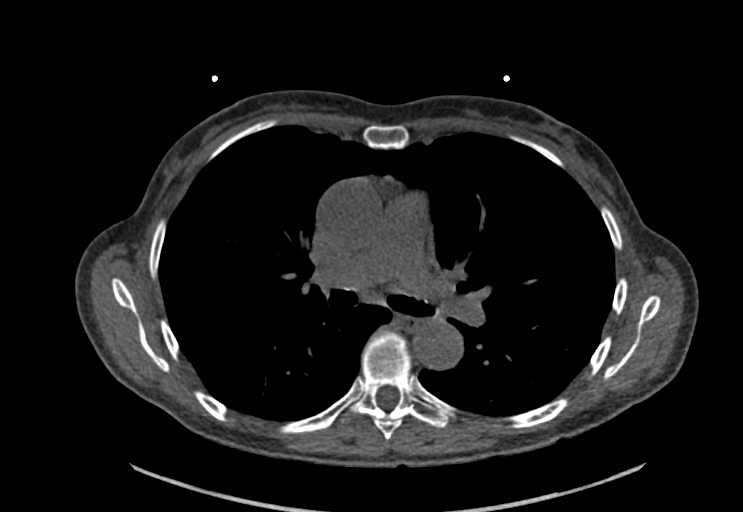
[im 56/67  lung]
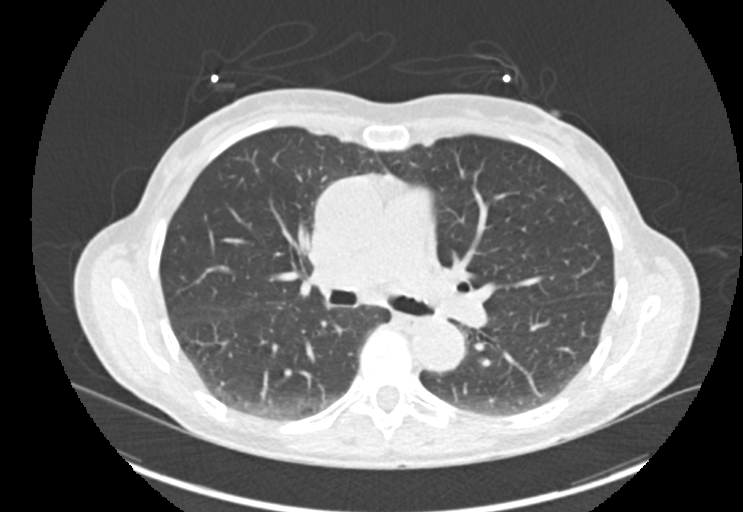

[Series 9: calcium scoring 2.00 br60 bestdiast 60% lungs · axial · 0.55mm/px · z∈[+1450,+1538]mm · 5 of 67 slices shown]
[im 12/67  vessel]
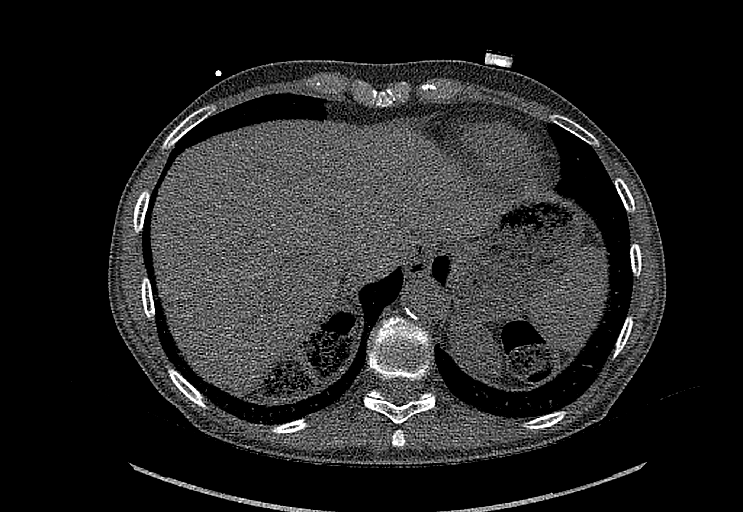
[im 23/67  vessel]
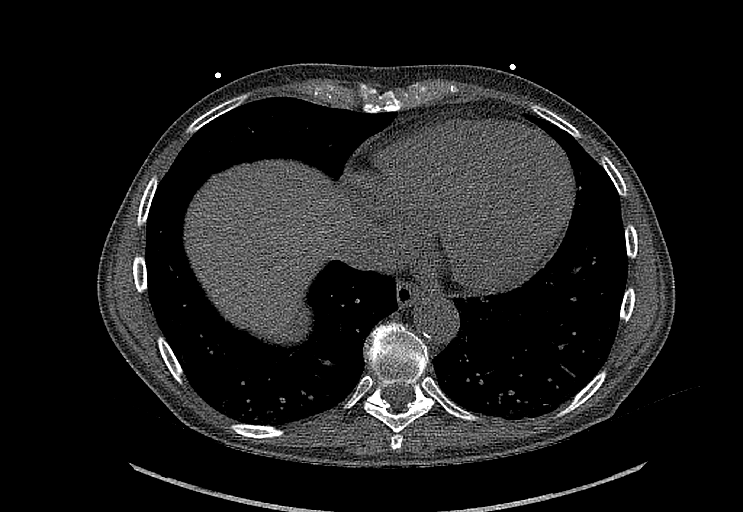
[im 34/67  vessel]
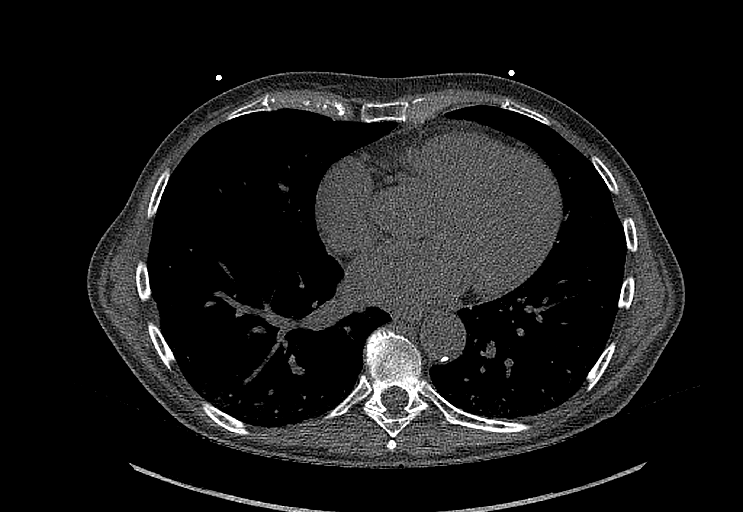
[im 45/67  vessel]
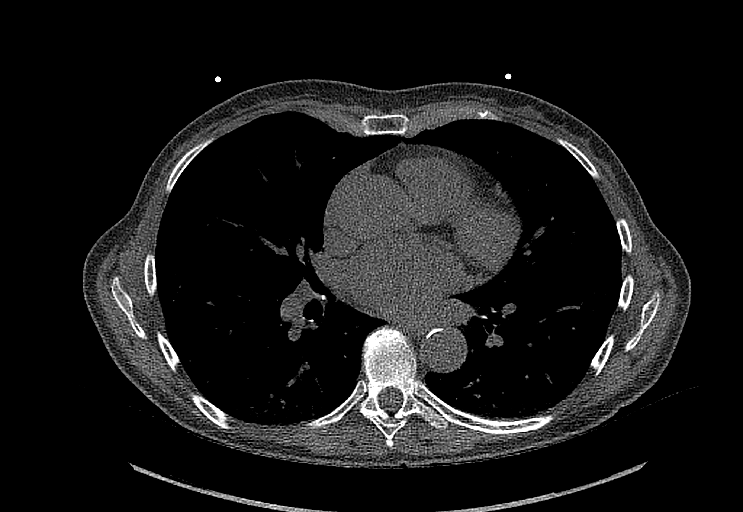
[im 56/67  vessel]
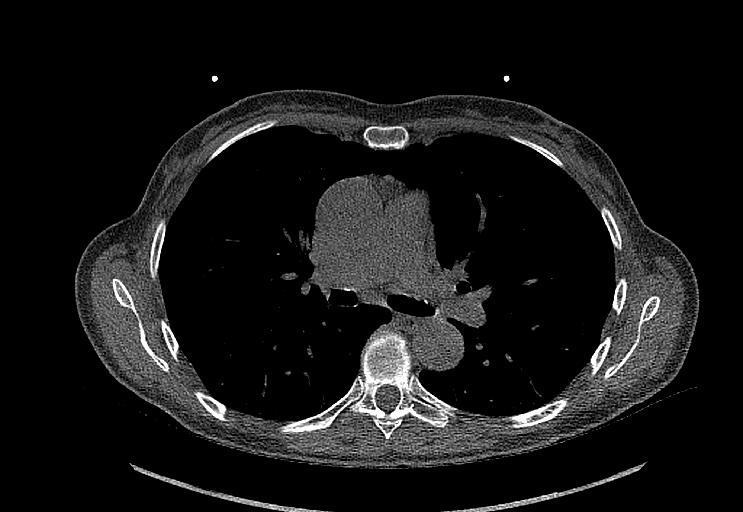

[12 of 20 positions shown; findings below may reference images not displayed]

FINDINGS: CORONARY CALCIUM SCORES:

Left Main: 0

LAD:

LCx: 0

RCA: 0

Total Agatston Score:

[HOSPITAL] percentile: 60

AORTA MEASUREMENTS:

Ascending Aorta: 38 mm

Descending Aorta: 28 mm

OTHER FINDINGS:

Heart size is normal. No significant pericardial fluid. Visualized
mediastinal structures are normal. Images of the upper abdomen are
unremarkable. 2 mm nodule in the right upper lobe on sequence 9
image 18. Punctate nodule in the right lower lobe on image 23.
Dependent densities in lower lobes are most compatible with
atelectasis. No large pleural effusions. 5 mm nodule in the left
upper lobe on sequence 9 image 14. No acute bone abnormality.
Atherosclerotic calcifications in the thoracic aorta.
IMPRESSION: 1. Coronary calcium score is 15.4 and this is at percentile 60 for
patients of the same age, gender and ethnicity.
2. Small pulmonary nodules, largest measures 5 mm. No follow-up
needed if patient is low-risk (and has no known or suspected primary
neoplasm). Non-contrast chest CT can be considered in 12 months if
patient is high-risk. This recommendation follows the consensus
statement: Guidelines for Management of Incidental Pulmonary Nodules
Detected on CT Images: From the [HOSPITAL] [FV]; Radiology
3.  Aortic Atherosclerosis ([FV]-[FV]).

## 2021-11-11 ENCOUNTER — Ambulatory Visit
Admission: RE | Admit: 2021-11-11 | Discharge: 2021-11-11 | Disposition: A | Discharge status: Home / Self Care | Payer: Medicare HMO | Source: Ambulatory Visit | Attending: Gastroenterology | Admitting: Gastroenterology

## 2021-11-11 DIAGNOSIS — K862 Cyst of pancreas: Secondary | ICD-10-CM | POA: Diagnosis not present

## 2021-11-11 DIAGNOSIS — K8689 Other specified diseases of pancreas: Secondary | ICD-10-CM

## 2021-11-11 IMAGING — MR MR ABDOMEN WO/W CM
14 of 21 series · 26 of 48 positions shown · IV contrast (12 ml Multihance)
Comparison: MRI abdomen [DATE]

CLINICAL DATA: Pancreatic duct calculus

EXAM:
MRI ABDOMEN WITHOUT AND WITH CONTRAST
TECHNIQUE: Multiplanar multisequence MR imaging of the abdomen was performed
both before and after the administration of intravenous contrast.
CONTRAST:  12mL MULTIHANCE GADOBENATE DIMEGLUMINE 529 MG/ML IV SOLN

[Series 3: cor haste · coronal · 5.0mm · 0.74mm/px · 2 of 32 slices shown]
[im 1/32]
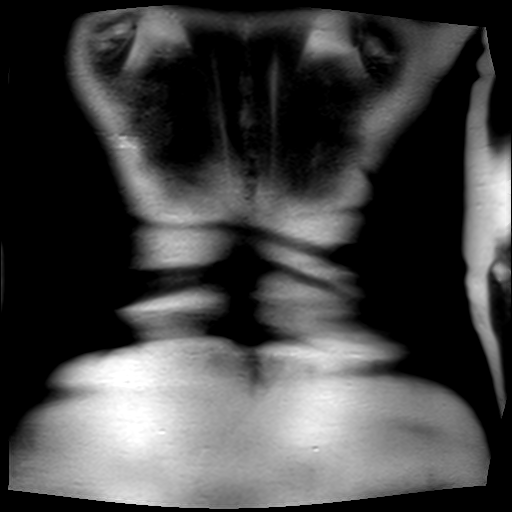
[im 32/32]
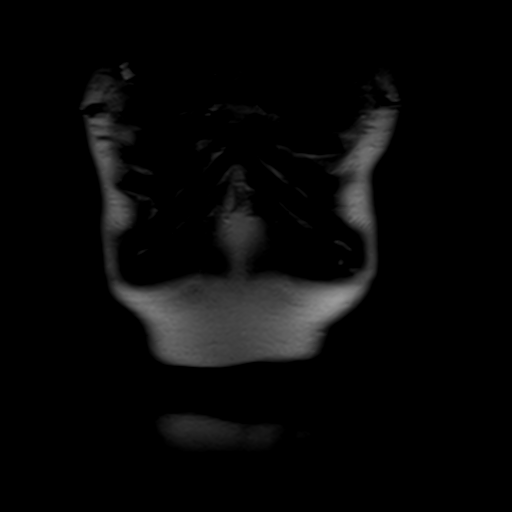

[Series 4: axial haste · axial · 6.0mm · 0.78mm/px · 1 of 33 slices shown]
[im 1/33]
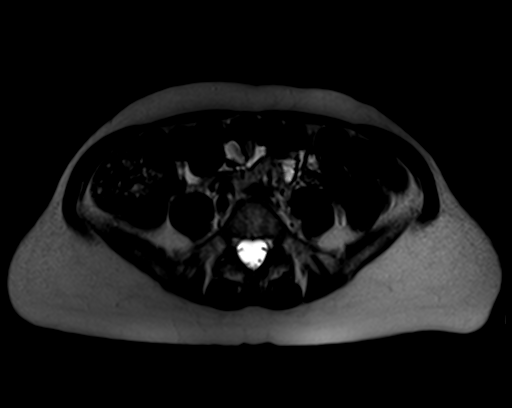

[Series 10: ep2d_diff_b50_500_800_p2_trig · axial · 6.0mm · 2.03mm/px · z∈[-131,+121]mm · 3 of 107 slices shown]
[im 1/107]
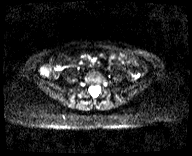
[im 54/107]
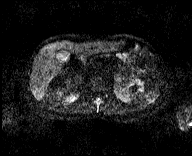
[im 107/107]
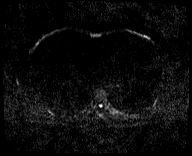

[Series 11: ep2d_diff_b50_500_800_p2_trig_adc · axial · 6.0mm · 2.03mm/px · 1 of 36 slices shown]
[im 1/36]
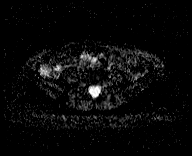

[Series 12: MRCP · oblique · 0.99mm/px · 1 of 17 slices shown]
[im 1/17]
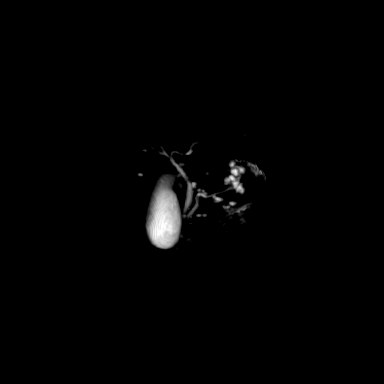

[Series 13: bSSFP · axial · 4.0mm · 0.72mm/px · z∈[-137,+103]mm · 2 of 61 slices shown (1 of 2)]
[im 1/61]
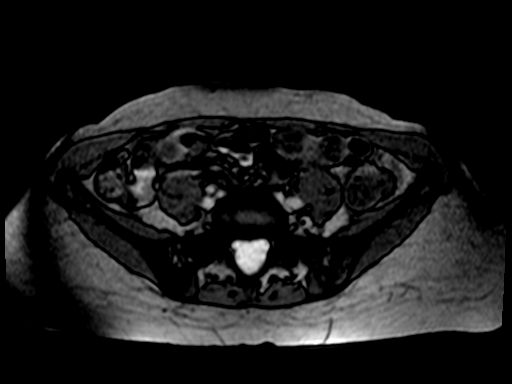
[im 61/61]
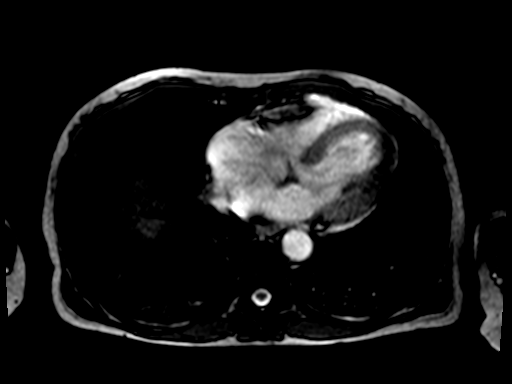

[Series 16: T2 fat-sat · axial · 6.0mm · 1.09mm/px · 1 of 32 slices shown]
[im 1/32]
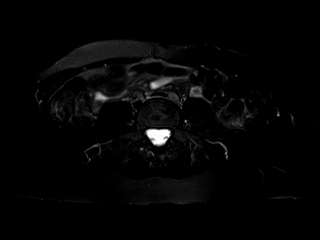

[Series 17: T2 · coronal · 3.0mm · 0.70mm/px · 2 of 55 slices shown]
[im 1/55]
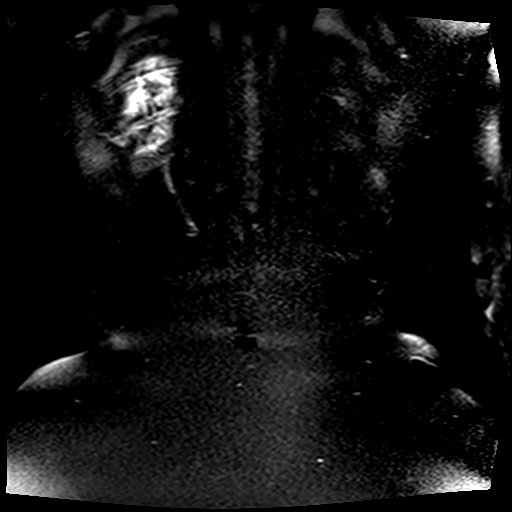
[im 55/55]
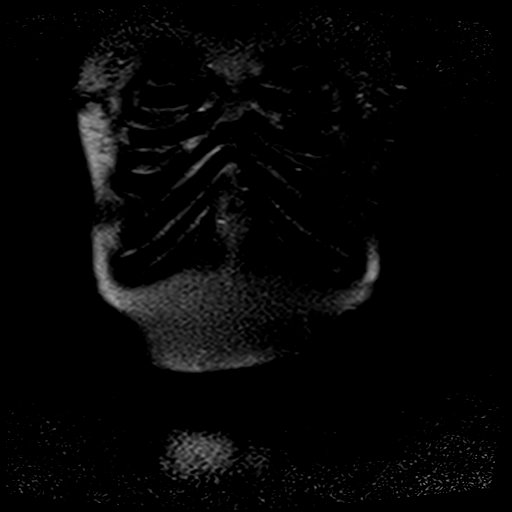

[Series 18: bSSFP · coronal · 5.0mm · 0.78mm/px · 1 of 32 slices shown (2 of 2)]
[im 1/32]
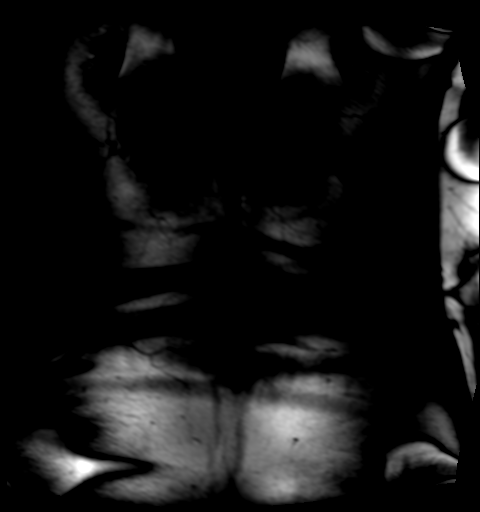

[Series 19: T1 · axial · 6.0mm · 0.74mm/px · z∈[-132,+99]mm · 2 of 72 slices shown]
[im 1/72]
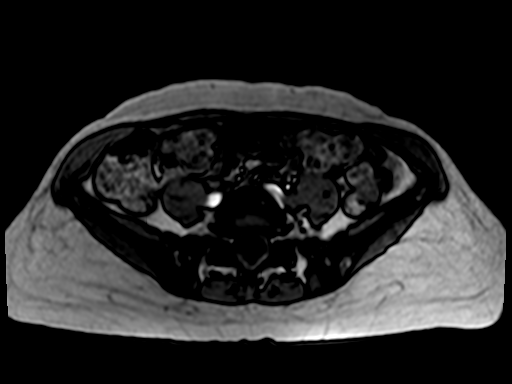
[im 72/72]
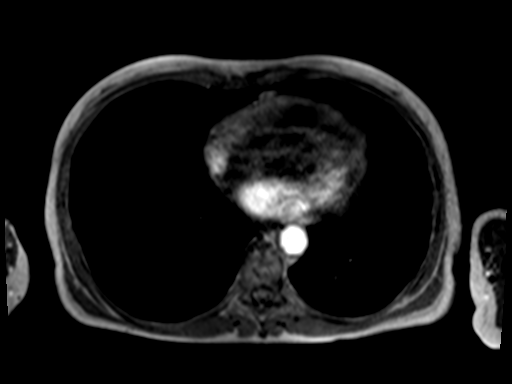

[Series 20: T1 dynamic · axial · non-contrast · 2.5mm · 0.74mm/px · z∈[-135,+102]mm · 3 of 96 slices shown]
[im 1/96]
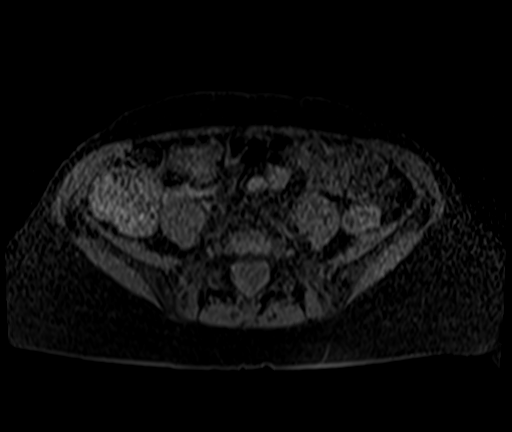
[im 48/96]
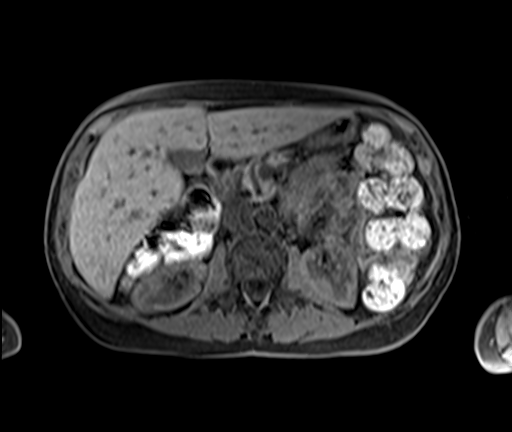
[im 96/96]
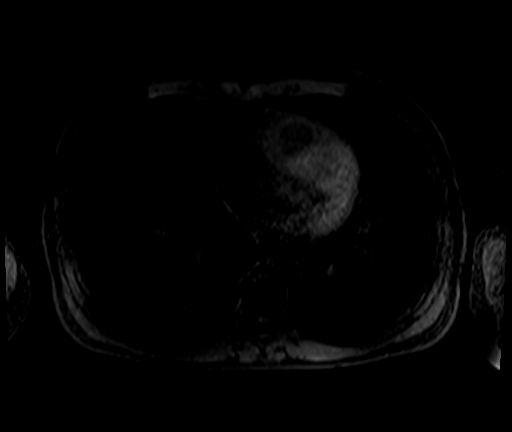

[Series 21: T1 dynamic post-contrast · axial · 2.5mm · 0.74mm/px · z∈[-135,+102]mm · 3 of 96 slices shown (1 of 3)]
[im 1/96]
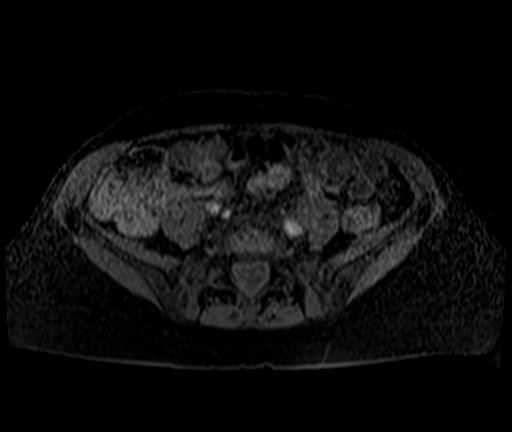
[im 48/96]
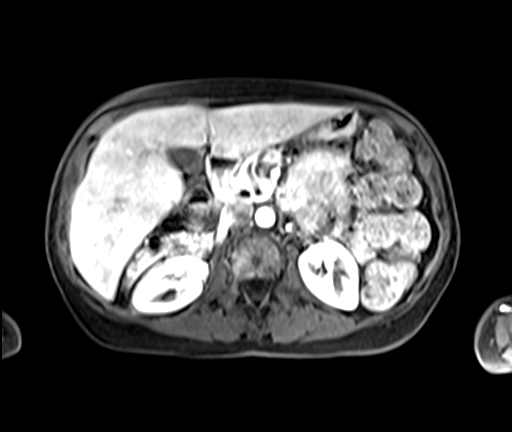
[im 96/96]
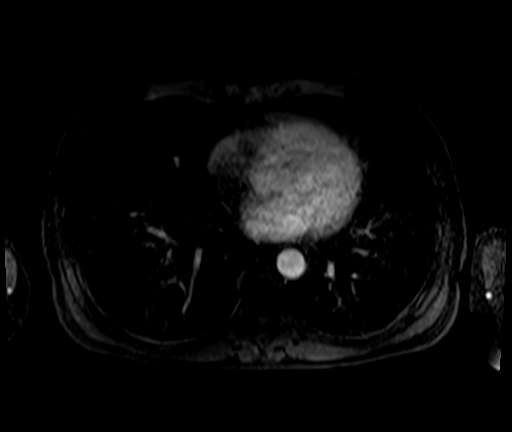

[Series 22: T1 dynamic post-contrast · axial · 2.5mm · 0.74mm/px · z∈[-135,+102]mm · 3 of 96 slices shown (2 of 3)]
[im 1/96]
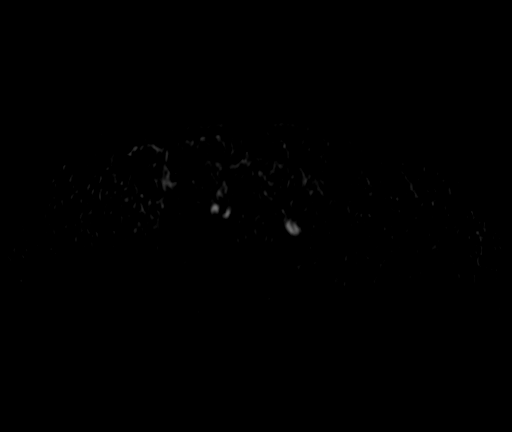
[im 48/96]
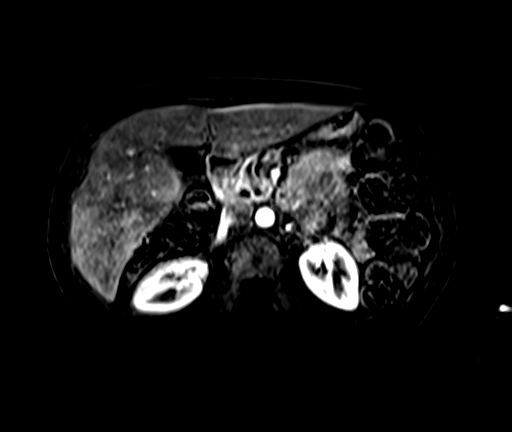
[im 96/96]
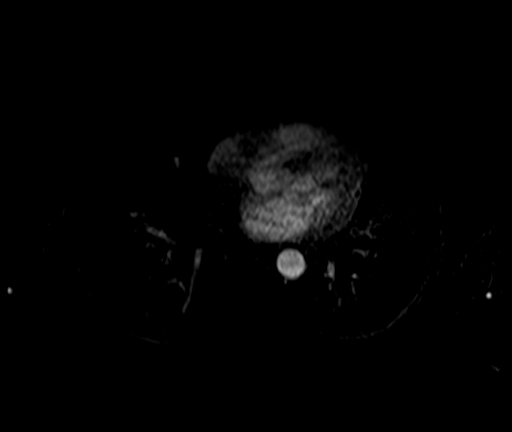

[Series 23: T1 dynamic post-contrast · axial · 2.5mm · 0.74mm/px · 1 of 96 slices shown (3 of 3)]
[im 1/96]
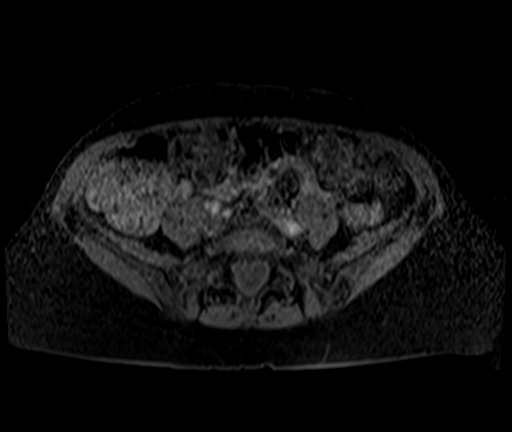

[26 of 48 positions shown; findings below may reference images not displayed]

FINDINGS: Study is limited due to motion.

Lower chest: No acute findings.

Hepatobiliary: Liver is normal in size and contour with no
suspicious mass visualized. A few tiny subcentimeter cysts are
scattered throughout the liver. Gallbladder appears normal. No
biliary ductal dilatation identified.

Pancreas: Multiple small cystic lesions are again seen throughout
the pancreas measuring up to 11 mm in the tail the pancreas and 10
mm in the body. Some of the lesions demonstrate communication with
the main pancreatic duct which is not abnormally dilated. No
definite suspicious enhancement identified in the cysts. No focal
filling defects identified within the pancreatic duct to suggest a
calculus.

Spleen:  Within normal limits in size and appearance.

Adrenals/Urinary Tract: Adrenal glands appear normal. A few
subcentimeter renal cortical cysts identified bilaterally. No
enhancing mass or hydronephrosis identified.

Stomach/Bowel: Visualized portions within the abdomen are
unremarkable.

Vascular/Lymphatic: No pathologically enlarged lymph nodes
identified. No abdominal aortic aneurysm demonstrated.

Other:  No ascites.

Musculoskeletal: No suspicious bone lesions identified.
IMPRESSION: 1. Multiple small pancreatic cystic lesions again seen, similar to
previous study suggesting side-branch IPMNs. Recommend continued
follow-up with MRI in 12 months.
2. Tiny hepatic and renal cysts.

## 2021-11-11 MED ORDER — GADOBENATE DIMEGLUMINE 529 MG/ML IV SOLN
12.0000 mL | Freq: Once | INTRAVENOUS | Status: AC | PRN
  Administered 2021-11-11: 12 mL via INTRAVENOUS

## 2021-12-21 DIAGNOSIS — H52223 Regular astigmatism, bilateral: Secondary | ICD-10-CM | POA: Diagnosis not present

## 2022-04-21 DIAGNOSIS — Z23 Encounter for immunization: Secondary | ICD-10-CM | POA: Diagnosis not present

## 2022-04-27 ENCOUNTER — Other Ambulatory Visit: Payer: Self-pay | Admitting: Obstetrics and Gynecology

## 2022-04-27 DIAGNOSIS — Z1231 Encounter for screening mammogram for malignant neoplasm of breast: Secondary | ICD-10-CM

## 2022-06-16 ENCOUNTER — Ambulatory Visit
Admission: RE | Admit: 2022-06-16 | Discharge: 2022-06-16 | Disposition: A | Discharge status: Home / Self Care | Payer: Medicare HMO | Source: Ambulatory Visit | Attending: Obstetrics and Gynecology | Admitting: Obstetrics and Gynecology

## 2022-06-16 DIAGNOSIS — Z1231 Encounter for screening mammogram for malignant neoplasm of breast: Secondary | ICD-10-CM

## 2022-08-02 DIAGNOSIS — Z85828 Personal history of other malignant neoplasm of skin: Secondary | ICD-10-CM | POA: Diagnosis not present

## 2022-08-02 DIAGNOSIS — L814 Other melanin hyperpigmentation: Secondary | ICD-10-CM | POA: Diagnosis not present

## 2022-08-02 DIAGNOSIS — L821 Other seborrheic keratosis: Secondary | ICD-10-CM | POA: Diagnosis not present

## 2022-08-02 DIAGNOSIS — D2272 Melanocytic nevi of left lower limb, including hip: Secondary | ICD-10-CM | POA: Diagnosis not present

## 2022-08-02 DIAGNOSIS — L578 Other skin changes due to chronic exposure to nonionizing radiation: Secondary | ICD-10-CM | POA: Diagnosis not present

## 2022-08-02 DIAGNOSIS — D225 Melanocytic nevi of trunk: Secondary | ICD-10-CM | POA: Diagnosis not present

## 2022-08-22 DIAGNOSIS — R69 Illness, unspecified: Secondary | ICD-10-CM | POA: Diagnosis not present

## 2022-08-26 DIAGNOSIS — R69 Illness, unspecified: Secondary | ICD-10-CM | POA: Diagnosis not present

## 2022-09-29 DIAGNOSIS — Z6822 Body mass index (BMI) 22.0-22.9, adult: Secondary | ICD-10-CM | POA: Diagnosis not present

## 2022-09-29 DIAGNOSIS — I10 Essential (primary) hypertension: Secondary | ICD-10-CM | POA: Diagnosis not present

## 2022-09-29 DIAGNOSIS — I700 Atherosclerosis of aorta: Secondary | ICD-10-CM | POA: Diagnosis not present

## 2022-09-29 DIAGNOSIS — Z0000 Encounter for general adult medical examination without abnormal findings: Secondary | ICD-10-CM | POA: Diagnosis not present

## 2022-09-29 DIAGNOSIS — R69 Illness, unspecified: Secondary | ICD-10-CM | POA: Diagnosis not present

## 2022-09-29 DIAGNOSIS — K900 Celiac disease: Secondary | ICD-10-CM | POA: Diagnosis not present

## 2022-09-29 DIAGNOSIS — F419 Anxiety disorder, unspecified: Secondary | ICD-10-CM | POA: Diagnosis not present

## 2022-09-29 DIAGNOSIS — E039 Hypothyroidism, unspecified: Secondary | ICD-10-CM | POA: Diagnosis not present

## 2022-09-29 DIAGNOSIS — G4700 Insomnia, unspecified: Secondary | ICD-10-CM | POA: Diagnosis not present

## 2022-09-29 DIAGNOSIS — M8588 Other specified disorders of bone density and structure, other site: Secondary | ICD-10-CM | POA: Diagnosis not present

## 2022-09-29 DIAGNOSIS — F324 Major depressive disorder, single episode, in partial remission: Secondary | ICD-10-CM | POA: Diagnosis not present

## 2022-09-29 DIAGNOSIS — E7800 Pure hypercholesterolemia, unspecified: Secondary | ICD-10-CM | POA: Diagnosis not present

## 2022-09-29 DIAGNOSIS — Z23 Encounter for immunization: Secondary | ICD-10-CM | POA: Diagnosis not present

## 2022-11-15 ENCOUNTER — Other Ambulatory Visit (HOSPITAL_COMMUNITY): Payer: Self-pay | Admitting: Gastroenterology

## 2022-11-15 DIAGNOSIS — K5903 Drug induced constipation: Secondary | ICD-10-CM | POA: Diagnosis not present

## 2022-11-15 DIAGNOSIS — K862 Cyst of pancreas: Secondary | ICD-10-CM

## 2022-11-15 DIAGNOSIS — R933 Abnormal findings on diagnostic imaging of other parts of digestive tract: Secondary | ICD-10-CM | POA: Diagnosis not present

## 2022-11-15 DIAGNOSIS — R140 Abdominal distension (gaseous): Secondary | ICD-10-CM | POA: Diagnosis not present

## 2022-11-28 ENCOUNTER — Other Ambulatory Visit (HOSPITAL_COMMUNITY): Payer: Self-pay | Admitting: Gastroenterology

## 2022-11-28 ENCOUNTER — Ambulatory Visit (HOSPITAL_COMMUNITY)
Admission: RE | Admit: 2022-11-28 | Discharge: 2022-11-28 | Disposition: A | Discharge status: Home / Self Care | Payer: Medicare HMO | Source: Ambulatory Visit | Attending: Gastroenterology | Admitting: Gastroenterology

## 2022-11-28 DIAGNOSIS — K8689 Other specified diseases of pancreas: Secondary | ICD-10-CM | POA: Diagnosis not present

## 2022-11-28 DIAGNOSIS — K862 Cyst of pancreas: Secondary | ICD-10-CM

## 2022-11-28 DIAGNOSIS — R935 Abnormal findings on diagnostic imaging of other abdominal regions, including retroperitoneum: Secondary | ICD-10-CM | POA: Diagnosis not present

## 2022-11-28 DIAGNOSIS — K3189 Other diseases of stomach and duodenum: Secondary | ICD-10-CM | POA: Diagnosis not present

## 2022-11-28 MED ORDER — GADOBUTROL 1 MMOL/ML IV SOLN
6.5000 mL | Freq: Once | INTRAVENOUS | Status: AC | PRN
  Administered 2022-11-28: 6.5 mL via INTRAVENOUS

## 2023-01-02 DIAGNOSIS — K5900 Constipation, unspecified: Secondary | ICD-10-CM | POA: Diagnosis not present

## 2023-01-02 DIAGNOSIS — Z91018 Allergy to other foods: Secondary | ICD-10-CM | POA: Diagnosis not present

## 2023-01-02 DIAGNOSIS — H52223 Regular astigmatism, bilateral: Secondary | ICD-10-CM | POA: Diagnosis not present

## 2023-01-02 DIAGNOSIS — F325 Major depressive disorder, single episode, in full remission: Secondary | ICD-10-CM | POA: Diagnosis not present

## 2023-01-02 DIAGNOSIS — I10 Essential (primary) hypertension: Secondary | ICD-10-CM | POA: Diagnosis not present

## 2023-01-02 DIAGNOSIS — F419 Anxiety disorder, unspecified: Secondary | ICD-10-CM | POA: Diagnosis not present

## 2023-01-02 DIAGNOSIS — E039 Hypothyroidism, unspecified: Secondary | ICD-10-CM | POA: Diagnosis not present

## 2023-05-16 ENCOUNTER — Other Ambulatory Visit: Payer: Self-pay | Admitting: Family Medicine

## 2023-05-16 DIAGNOSIS — Z1231 Encounter for screening mammogram for malignant neoplasm of breast: Secondary | ICD-10-CM

## 2023-05-31 ENCOUNTER — Encounter: Payer: Self-pay | Admitting: Obstetrics and Gynecology

## 2023-05-31 ENCOUNTER — Ambulatory Visit (INDEPENDENT_AMBULATORY_CARE_PROVIDER_SITE_OTHER): Payer: Medicare HMO | Admitting: Obstetrics and Gynecology

## 2023-05-31 ENCOUNTER — Ambulatory Visit: Payer: Medicare HMO | Admitting: Obstetrics and Gynecology

## 2023-05-31 VITALS — BP 118/76 | HR 57 | Ht 63.75 in | Wt 136.0 lb

## 2023-05-31 DIAGNOSIS — E2839 Other primary ovarian failure: Secondary | ICD-10-CM | POA: Diagnosis not present

## 2023-05-31 DIAGNOSIS — E042 Nontoxic multinodular goiter: Secondary | ICD-10-CM

## 2023-05-31 DIAGNOSIS — Z01419 Encounter for gynecological examination (general) (routine) without abnormal findings: Secondary | ICD-10-CM | POA: Diagnosis not present

## 2023-05-31 DIAGNOSIS — Z1211 Encounter for screening for malignant neoplasm of colon: Secondary | ICD-10-CM

## 2023-05-31 NOTE — Progress Notes (Signed)
67 y.o. y.o. female here for annual exam. Patient's last menstrual period was 11/08/2001 (approximate).   Mother with breast cancer in her 5's, patient with negative BRCA testing.  Sister with breast cance ras well  Patient's last menstrual period was 11/08/2001 (approximate).         Hysterectomy with ovaries intact, per patient for bleeding and fibroids Sexually active: Yes.    The current method of family planning is status post hysterectomy.    Exercising: Yes.    Yoga and walking  Smoker:  no Chronic constipation H/o thyroid nodules: repeat US ordered Health Maintenance: Pap:  unsure History of abnormal Pap:  no MMG:  03/08/21- birads 0, 04/05/21- Diag birads 2, [place]- birads 2. Breast cysts, 2024 scheduled BMD:   11/18/19- osteopenia T-score -2.0, FRAX 7.7/0.5%, referral placed Colonoscopy: cologuard done.  TDaP:  04/11/2015 Gardasil: N/a Body mass index is 23.53 kg/m.      No data to display          Blood pressure 118/76, pulse (!) 57, height 5' 3.75" (1.619 m), weight 136 lb (61.7 kg), last menstrual period 11/08/2001, SpO2 98%.  No results found for: "DIAGPAP", "HPVHIGH", "ADEQPAP"  GYN HISTORY: No results found for: "DIAGPAP", "HPVHIGH", "ADEQPAP"  OB History  Gravida Para Term Preterm AB Living  5 5 5  0 0 5  SAB IAB Ectopic Multiple Live Births  0 0 0 0 5    # Outcome Date GA Lbr Len/2nd Weight Sex Type Anes PTL Lv  5 Term         LIV  4 Term         LIV  3 Term         LIV  2 Term         LIV  1 Term         LIV    Past Medical History:  Diagnosis Date   BRCA negative 01/2010   BRCA I/ II negative, done secondary to mother's history of Breast Cancer at age 72   Celiac sprue 3/07   History of depression    Hypertension    Multinodular thyroid 05/27/2021   Osteopenia    Thyroid disease    hypothyroid    Past Surgical History:  Procedure Laterality Date   ABDOMINAL HYSTERECTOMY     CESAREAN SECTION  1987   COLONOSCOPY  10/04/05   celiac    ESOPHAGOGASTRODUODENOSCOPY ENDOSCOPY  10/04/05   Biopsy shows celiac   FEMORAL HERNIA REPAIR Right 07/2004   VAGINAL DELIVERY     x4   VAGINAL HYSTERECTOMY  11/27/2001   secondary to adenomyosis, ovaries remain    Current Outpatient Medications on File Prior to Visit  Medication Sig Dispense Refill   ALPRAZolam (XANAX) 0.25 MG tablet Take 0.25 mg by mouth at bedtime as needed for sleep.     buPROPion (WELLBUTRIN XL) 300 MG 24 hr tablet Take 300 mg by mouth daily.     diltiazem (CARDIZEM CD) 360 MG 24 hr capsule Take 360 mg by mouth daily.     docusate sodium (COLACE) 100 MG capsule Take 100 mg by mouth 2 (two) times daily.     Glucosamine-Chondroit-Vit C-Mn (GLUCOSAMINE CHONDR 1500 COMPLX) CAPS Take 2 capsules by mouth daily.     levothyroxine (SYNTHROID, LEVOTHROID) 50 MCG tablet Take 50 mcg by mouth daily before breakfast.     Multiple Vitamin (MULTIVITAMIN) tablet Take 1 tablet by mouth daily.     Omega-3 Fatty Acids (FISH OIL)  1000 MG CAPS Take 2,000 mg by mouth daily.     PARoxetine (PAXIL) 10 MG tablet Take 10 mg by mouth every morning.     polyethylene glycol (MIRALAX / GLYCOLAX) packet Take 17 g by mouth daily.     traZODone (DESYREL) 150 MG tablet Take 75 mg by mouth at bedtime.  0   TRULANCE 3 MG TABS Take 1 tablet by mouth daily.     valsartan-hydrochlorothiazide (DIOVAN-HCT) 320-25 MG tablet Take 1 tablet by mouth daily.     No current facility-administered medications on file prior to visit.    Social History   Socioeconomic History   Marital status: Married    Spouse name: Not on file   Number of children: 5   Years of education: Not on file   Highest education level: Not on file  Occupational History   Not on file  Tobacco Use   Smoking status: Never   Smokeless tobacco: Never  Vaping Use   Vaping status: Never Used  Substance and Sexual Activity   Alcohol use: No   Drug use: No   Sexual activity: Yes    Partners: Male    Birth control/protection: Surgical     Comment: hysterectomy, older than 16, less than 5  Other Topics Concern   Not on file  Social History Narrative   Not on file   Social Determinants of Health   Financial Resource Strain: Not on file  Food Insecurity: Not on file  Transportation Needs: Not on file  Physical Activity: Not on file  Stress: Not on file  Social Connections: Not on file  Intimate Partner Violence: Not on file    Family History  Problem Relation Age of Onset   Breast cancer Mother 27       second time postmenopausal   Prostate cancer Father    Chronic Renal Failure Father    Hypertension Brother    Breast cancer Maternal Aunt        postmenopausal   Hypertension Daughter    Hypertension Daughter      Allergies  Allergen Reactions   Banana Diarrhea    Severe stomach cramps       Patient's last menstrual period was Patient's last menstrual period was 11/08/2001 (approximate)..            Review of Systems Alls systems reviewed and are negative.     Physical Exam Constitutional:      Appearance: Normal appearance.  Genitourinary:     Vulva normal.     No lesions in the vagina.     Genitourinary Comments: Rectocele seen     Right Labia: No rash, lesions or skin changes.    Left Labia: No lesions, skin changes or rash.    Vaginal cuff intact.    No vaginal discharge or tenderness.     No vaginal prolapse present.    Mild vaginal atrophy present.     Right Adnexa: not tender and no mass present.    Left Adnexa: not tender and no mass present.    Cervix is not absent.     Uterus is not absent. Breasts:    Right: Normal.     Left: Normal.  HENT:     Head: Normocephalic.  Neck:     Thyroid: No thyroid mass, thyromegaly or thyroid tenderness.  Cardiovascular:     Rate and Rhythm: Normal rate and regular rhythm.     Heart sounds: Normal heart sounds, S1 normal and S2 normal.  Pulmonary:     Effort: Pulmonary effort is normal.     Breath sounds: Normal breath sounds and  air entry.  Abdominal:     General: There is no distension.     Palpations: Abdomen is soft. There is no mass.     Tenderness: There is no abdominal tenderness. There is no guarding or rebound.  Musculoskeletal:        General: Normal range of motion.     Cervical back: Full passive range of motion without pain, normal range of motion and neck supple. No tenderness.     Right lower leg: No edema.     Left lower leg: No edema.  Neurological:     Mental Status: She is alert.  Skin:    General: Skin is warm.  Psychiatric:        Mood and Affect: Mood normal.        Behavior: Behavior normal.        Thought Content: Thought content normal.  Vitals and nursing note reviewed. Exam conducted with a chaperone present.       A:         Well Woman GYN exam                             P:        Pap smear not indicated Encouraged annual mammogram screening Colon cancer screening referral placed today DXA ordered today Labs and immunizations ordered today Discussed breast self exams Encouraged healthy lifestyle practices Encouraged Vit D and Calcium   No follow-ups on file.  Earley Favor

## 2023-06-20 ENCOUNTER — Ambulatory Visit
Admission: RE | Admit: 2023-06-20 | Discharge: 2023-06-20 | Disposition: A | Discharge status: Home / Self Care | Payer: Medicare HMO | Source: Ambulatory Visit | Attending: Family Medicine | Admitting: Family Medicine

## 2023-06-20 DIAGNOSIS — Z1231 Encounter for screening mammogram for malignant neoplasm of breast: Secondary | ICD-10-CM | POA: Diagnosis not present

## 2023-06-23 ENCOUNTER — Other Ambulatory Visit: Payer: Self-pay | Admitting: Family Medicine

## 2023-06-23 DIAGNOSIS — R928 Other abnormal and inconclusive findings on diagnostic imaging of breast: Secondary | ICD-10-CM

## 2023-07-19 ENCOUNTER — Ambulatory Visit
Admission: RE | Admit: 2023-07-19 | Discharge: 2023-07-19 | Disposition: A | Discharge status: Home / Self Care | Payer: Medicare HMO | Source: Ambulatory Visit | Attending: Family Medicine | Admitting: Family Medicine

## 2023-07-19 ENCOUNTER — Other Ambulatory Visit: Payer: Self-pay | Admitting: Family Medicine

## 2023-07-19 DIAGNOSIS — R928 Other abnormal and inconclusive findings on diagnostic imaging of breast: Secondary | ICD-10-CM

## 2023-07-19 DIAGNOSIS — N6310 Unspecified lump in the right breast, unspecified quadrant: Secondary | ICD-10-CM

## 2023-07-19 DIAGNOSIS — N6315 Unspecified lump in the right breast, overlapping quadrants: Secondary | ICD-10-CM | POA: Diagnosis not present

## 2023-07-19 DIAGNOSIS — N6311 Unspecified lump in the right breast, upper outer quadrant: Secondary | ICD-10-CM | POA: Diagnosis not present

## 2023-07-24 ENCOUNTER — Ambulatory Visit
Admission: RE | Admit: 2023-07-24 | Discharge: 2023-07-24 | Disposition: A | Discharge status: Home / Self Care | Payer: Medicare HMO | Source: Ambulatory Visit | Attending: Family Medicine | Admitting: Family Medicine

## 2023-07-24 DIAGNOSIS — N6011 Diffuse cystic mastopathy of right breast: Secondary | ICD-10-CM | POA: Diagnosis not present

## 2023-07-24 DIAGNOSIS — N6311 Unspecified lump in the right breast, upper outer quadrant: Secondary | ICD-10-CM | POA: Diagnosis not present

## 2023-07-24 DIAGNOSIS — N6310 Unspecified lump in the right breast, unspecified quadrant: Secondary | ICD-10-CM

## 2023-07-24 DIAGNOSIS — N6012 Diffuse cystic mastopathy of left breast: Secondary | ICD-10-CM | POA: Diagnosis not present

## 2023-07-24 DIAGNOSIS — N6312 Unspecified lump in the right breast, upper inner quadrant: Secondary | ICD-10-CM | POA: Diagnosis not present

## 2023-07-24 HISTORY — PX: BREAST BIOPSY: SHX20

## 2023-07-25 LAB — SURGICAL PATHOLOGY

## 2023-08-17 ENCOUNTER — Emergency Department (HOSPITAL_BASED_OUTPATIENT_CLINIC_OR_DEPARTMENT_OTHER)
Admission: EM | Admit: 2023-08-17 | Discharge: 2023-08-18 | Disposition: A | Discharge status: Home / Self Care | Payer: Medicare HMO | Attending: Emergency Medicine | Admitting: Emergency Medicine

## 2023-08-17 ENCOUNTER — Encounter (HOSPITAL_BASED_OUTPATIENT_CLINIC_OR_DEPARTMENT_OTHER): Payer: Self-pay | Admitting: Radiology

## 2023-08-17 ENCOUNTER — Other Ambulatory Visit: Payer: Self-pay

## 2023-08-17 DIAGNOSIS — Z20822 Contact with and (suspected) exposure to covid-19: Secondary | ICD-10-CM | POA: Diagnosis not present

## 2023-08-17 DIAGNOSIS — E039 Hypothyroidism, unspecified: Secondary | ICD-10-CM | POA: Diagnosis not present

## 2023-08-17 DIAGNOSIS — R110 Nausea: Secondary | ICD-10-CM | POA: Diagnosis not present

## 2023-08-17 DIAGNOSIS — R112 Nausea with vomiting, unspecified: Secondary | ICD-10-CM | POA: Diagnosis not present

## 2023-08-17 DIAGNOSIS — R7989 Other specified abnormal findings of blood chemistry: Secondary | ICD-10-CM | POA: Insufficient documentation

## 2023-08-17 DIAGNOSIS — J101 Influenza due to other identified influenza virus with other respiratory manifestations: Secondary | ICD-10-CM | POA: Insufficient documentation

## 2023-08-17 DIAGNOSIS — I10 Essential (primary) hypertension: Secondary | ICD-10-CM | POA: Diagnosis not present

## 2023-08-17 DIAGNOSIS — Z853 Personal history of malignant neoplasm of breast: Secondary | ICD-10-CM | POA: Diagnosis not present

## 2023-08-17 LAB — COMPREHENSIVE METABOLIC PANEL
ALT: 74 U/L — ABNORMAL HIGH (ref 0–44)
AST: 62 U/L — ABNORMAL HIGH (ref 15–41)
Albumin: 4.1 g/dL (ref 3.5–5.0)
Alkaline Phosphatase: 58 U/L (ref 38–126)
Anion gap: 13 (ref 5–15)
BUN: 12 mg/dL (ref 8–23)
CO2: 28 mmol/L (ref 22–32)
Calcium: 9.5 mg/dL (ref 8.9–10.3)
Chloride: 97 mmol/L — ABNORMAL LOW (ref 98–111)
Creatinine, Ser: 0.85 mg/dL (ref 0.44–1.00)
GFR, Estimated: >60 mL/min (ref >60)
Glucose, Bld: 111 mg/dL — ABNORMAL HIGH (ref 70–99)
Potassium: 3.5 mmol/L (ref 3.5–5.1)
Sodium: 138 mmol/L (ref 135–145)
Total Bilirubin: 0.9 mg/dL (ref 0.0–1.2)
Total Protein: 7.7 g/dL (ref 6.5–8.1)

## 2023-08-17 LAB — CBC
HCT: 42 % (ref 36.0–46.0)
Hemoglobin: 14.6 g/dL (ref 12.0–15.0)
MCH: 32.1 pg (ref 26.0–34.0)
MCHC: 34.8 g/dL (ref 30.0–36.0)
MCV: 92.3 fL (ref 80.0–100.0)
Platelets: 261 10*3/uL (ref 150–400)
RBC: 4.55 MIL/uL (ref 3.87–5.11)
RDW: 13.1 % (ref 11.5–15.5)
WBC: 5.7 10*3/uL (ref 4.0–10.5)
nRBC: 0 % (ref 0.0–0.2)

## 2023-08-17 LAB — RESP PANEL BY RT-PCR (RSV, FLU A&B, COVID)  RVPGX2
Influenza A by PCR: POSITIVE — AB
Influenza B by PCR: NEGATIVE
Resp Syncytial Virus by PCR: NEGATIVE
SARS Coronavirus 2 by RT PCR: NEGATIVE

## 2023-08-17 LAB — LIPASE, BLOOD: Lipase: 30 U/L (ref 11–51)

## 2023-08-17 LAB — MAGNESIUM: Magnesium: 2.1 mg/dL (ref 1.7–2.4)

## 2023-08-17 MED ORDER — ONDANSETRON HCL 4 MG/2ML IJ SOLN
4.0000 mg | Freq: Once | INTRAMUSCULAR | Status: AC | PRN
  Administered 2023-08-17: 4 mg via INTRAVENOUS
  Filled 2023-08-17: qty 2

## 2023-08-17 MED ORDER — PROMETHAZINE HCL 25 MG/ML IJ SOLN
INTRAMUSCULAR | Status: AC
  Filled 2023-08-17: qty 1

## 2023-08-17 MED ORDER — SODIUM CHLORIDE 0.9 % IV SOLN
12.5000 mg | Freq: Once | INTRAVENOUS | Status: AC
  Administered 2023-08-17: 12.5 mg via INTRAVENOUS
  Filled 2023-08-17: qty 0.5

## 2023-08-17 MED ORDER — SODIUM CHLORIDE 0.9 % IV BOLUS
1000.0000 mL | Freq: Once | INTRAVENOUS | Status: AC
  Administered 2023-08-17: 1000 mL via INTRAVENOUS

## 2023-08-17 MED ORDER — LORAZEPAM 2 MG/ML IJ SOLN
1.0000 mg | Freq: Once | INTRAMUSCULAR | Status: AC
  Administered 2023-08-17: 1 mg via INTRAVENOUS
  Filled 2023-08-17: qty 1

## 2023-08-17 NOTE — ED Provider Notes (Signed)
 Emergency Department Provider Note   I have reviewed the triage vital signs and the nursing notes.   HISTORY  Chief Complaint Emesis and Weakness   HPI Cynthia Alexander is a 68 y.o. female with past history of hypertension presents emergency department for nausea and vomiting.  Symptoms were severe this evening.  Patient has had URI symptoms over the past 4 to 5 days.  Has been sick with some upper respiratory infection symptoms as well.  Severe vomiting began this evening. No abdominal pain, chest pain, or HA.    Past Medical History:  Diagnosis Date   BRCA negative 01/2010   BRCA I/ II negative, done secondary to mother's history of Breast Cancer at age 81   Celiac sprue 3/07   History of depression    Hypertension    Multinodular thyroid  05/27/2021   Osteopenia    Thyroid  disease    hypothyroid    Review of Systems  Constitutional: No fever/chills. Positive fatigue.  Cardiovascular: Denies chest pain. Respiratory: Denies shortness of breath. Gastrointestinal: No abdominal pain. Positive nausea and vomiting.  No diarrhea.  No constipation. Skin: Negative for rash. Neurological: Negative for headaches.  ____________________________________________   PHYSICAL EXAM:  VITAL SIGNS: ED Triage Vitals  Encounter Vitals Group     BP 08/17/23 2004 (!) 148/97     Pulse Rate 08/17/23 2004 70     Resp 08/17/23 2004 (!) 22     Temp 08/17/23 2004 98 F (36.7 C)     Temp src --      SpO2 08/17/23 2004 98 %     Weight 08/17/23 2006 136 lb (61.7 kg)     Height 08/17/23 2006 5' 3 (1.6 m)   Constitutional: Alert and oriented. Patient laying on her side. Appears uncomfortable.  Eyes: Conjunctivae are normal.  Head: Atraumatic. Nose: No congestion/rhinnorhea. Mouth/Throat: Mucous membranes are moist.  Neck: No stridor. Cardiovascular: Normal rate, regular rhythm. Good peripheral circulation. Grossly normal heart sounds.   Respiratory: Normal respiratory effort.  No  retractions. Lungs CTAB. Gastrointestinal: Soft and nontender. No distention.  Musculoskeletal:  No gross deformities of extremities. Neurologic:  Normal speech and language.  Skin:  Skin is warm, dry and intact. No rash noted.  ____________________________________________   LABS (all labs ordered are listed, but only abnormal results are displayed)  Labs Reviewed  RESP PANEL BY RT-PCR (RSV, FLU A&B, COVID)  RVPGX2 - Abnormal; Notable for the following components:      Result Value   Influenza A by PCR POSITIVE (*)    All other components within normal limits  COMPREHENSIVE METABOLIC PANEL - Abnormal; Notable for the following components:   Chloride 97 (*)    Glucose, Bld 111 (*)    AST 62 (*)    ALT 74 (*)    All other components within normal limits  LIPASE, BLOOD  CBC  MAGNESIUM    ____________________________________________  EKG   EKG Interpretation Date/Time:  Thursday August 17 2023 21:42:30 EST Ventricular Rate:  51 PR Interval:    QRS Duration:  138 QT Interval:  553 QTC Calculation: 510 R Axis:   -2  Text Interpretation: Sinus rhythm IVCD, consider atypical RBBB No previous ECGs available Confirmed by Dreama Longs ([id number]) on 08/18/2023 2:46:40 PM       ____________________________________________   PROCEDURES  Procedure(s) performed:   Procedures  None ____________________________________________   INITIAL IMPRESSION / ASSESSMENT AND PLAN / ED COURSE  Pertinent labs & imaging results that were available during my  care of the patient were reviewed by me and considered in my medical decision making (see chart for details).   This patient is Presenting for Evaluation of vomiting, which does require a range of treatment options, and is a complaint that involves a high risk of morbidity and mortality.  The Differential Diagnoses includes but is not exclusive to acute cholecystitis, intrathoracic causes for epigastric abdominal pain, gastritis,  duodenitis, pancreatitis, small bowel or large bowel obstruction, abdominal aortic aneurysm, hernia, gastritis, etc.   Critical Interventions-    Medications  ondansetron  (ZOFRAN ) injection 4 mg (4 mg Intravenous Given 08/17/23 2021)  sodium chloride  0.9 % bolus 1,000 mL (0 mLs Intravenous Stopped 08/17/23 2200)  LORazepam  (ATIVAN ) injection 1 mg (1 mg Intravenous Given 08/17/23 2118)  promethazine  (PHENERGAN ) 12.5 mg in sodium chloride  0.9 % 50 mL IVPB (0 mg Intravenous Stopped 08/17/23 2353)    Reassessment after intervention:  Nausea improved.    I did obtain Additional Historical Information from husband at bedside.    Clinical Laboratory Tests Ordered, included Flu A positive. No AKI. LFTs minimally elevated. CBC without leukocytosis.   Cardiac Monitor Tracing which shows NSR.    Social Determinants of Health Risk No EtOH or drug use.   Medical Decision Making: Summary:  Patient presents emergency department with vomiting in the setting of upper respiratory infection symptoms.  Clinically suspect flu. Plan for screening labs and symptom mgmt.   Reevaluation with update and discussion with patient feeling improved but PO challenge is pending. Care transferred to oncoming team.    Considered admission but PO challenge is pending.   Patient's presentation is most consistent with acute presentation with potential threat to life or bodily function.   Disposition: discharge  ____________________________________________  FINAL CLINICAL IMPRESSION(S) / ED DIAGNOSES  Final diagnoses:  Influenza A     NEW OUTPATIENT MEDICATIONS STARTED DURING THIS VISIT:  Discharge Medication List as of 08/18/2023 12:07 AM     START taking these medications   Details  ondansetron  (ZOFRAN ) 4 MG tablet Take 1 tablet (4 mg total) by mouth every 8 (eight) hours as needed for nausea or vomiting., Starting Fri 08/18/2023, Normal        Note:  This document was prepared using Dragon voice recognition  software and may include unintentional dictation errors.  Fonda Law, MD, [facility] Emergency Medicine    Rowland Ericsson, Fonda MATSU, MD 08/23/23 1351

## 2023-08-17 NOTE — ED Triage Notes (Signed)
 Correction, was not Zofran   pt received phenergan .

## 2023-08-17 NOTE — ED Triage Notes (Signed)
 Pt had a cold Sunday with a runny nose. Monday she woke with vertigo. Got meclizine  and woke yesterday and was better. Today she woke and she is weaker than normal. Pt states she is no longer dizzy. Denies abd pain but endorses n/v/and feels like she could have diarrhea. Pt was seen by her primary care and given zofran  IM around 5pm. Pt states that it didn't help any.

## 2023-08-18 MED ORDER — ONDANSETRON HCL 4 MG PO TABS: 4.0000 mg | ORAL_TABLET | Freq: Three times a day (TID) | ORAL | 0 refills | Status: AC | PRN

## 2023-08-24 DIAGNOSIS — D485 Neoplasm of uncertain behavior of skin: Secondary | ICD-10-CM | POA: Diagnosis not present

## 2023-08-24 DIAGNOSIS — D225 Melanocytic nevi of trunk: Secondary | ICD-10-CM | POA: Diagnosis not present

## 2023-08-24 DIAGNOSIS — L578 Other skin changes due to chronic exposure to nonionizing radiation: Secondary | ICD-10-CM | POA: Diagnosis not present

## 2023-08-24 DIAGNOSIS — Z85828 Personal history of other malignant neoplasm of skin: Secondary | ICD-10-CM | POA: Diagnosis not present

## 2023-08-24 DIAGNOSIS — L720 Epidermal cyst: Secondary | ICD-10-CM | POA: Diagnosis not present

## 2023-08-24 DIAGNOSIS — L821 Other seborrheic keratosis: Secondary | ICD-10-CM | POA: Diagnosis not present

## 2023-08-24 DIAGNOSIS — L814 Other melanin hyperpigmentation: Secondary | ICD-10-CM | POA: Diagnosis not present

## 2023-08-24 DIAGNOSIS — D2272 Melanocytic nevi of left lower limb, including hip: Secondary | ICD-10-CM | POA: Diagnosis not present

## 2023-09-25 DIAGNOSIS — Z1211 Encounter for screening for malignant neoplasm of colon: Secondary | ICD-10-CM | POA: Diagnosis not present

## 2023-09-25 DIAGNOSIS — K5909 Other constipation: Secondary | ICD-10-CM | POA: Diagnosis not present

## 2023-09-25 DIAGNOSIS — K862 Cyst of pancreas: Secondary | ICD-10-CM | POA: Diagnosis not present

## 2023-10-03 DIAGNOSIS — E039 Hypothyroidism, unspecified: Secondary | ICD-10-CM | POA: Diagnosis not present

## 2023-10-03 DIAGNOSIS — E7800 Pure hypercholesterolemia, unspecified: Secondary | ICD-10-CM | POA: Diagnosis not present

## 2023-10-03 DIAGNOSIS — I10 Essential (primary) hypertension: Secondary | ICD-10-CM | POA: Diagnosis not present

## 2023-10-10 DIAGNOSIS — Z79899 Other long term (current) drug therapy: Secondary | ICD-10-CM | POA: Diagnosis not present

## 2023-10-10 DIAGNOSIS — Z6822 Body mass index (BMI) 22.0-22.9, adult: Secondary | ICD-10-CM | POA: Diagnosis not present

## 2023-10-10 DIAGNOSIS — E039 Hypothyroidism, unspecified: Secondary | ICD-10-CM | POA: Diagnosis not present

## 2023-10-10 DIAGNOSIS — I10 Essential (primary) hypertension: Secondary | ICD-10-CM | POA: Diagnosis not present

## 2023-10-10 DIAGNOSIS — E7800 Pure hypercholesterolemia, unspecified: Secondary | ICD-10-CM | POA: Diagnosis not present

## 2023-10-10 DIAGNOSIS — G479 Sleep disorder, unspecified: Secondary | ICD-10-CM | POA: Diagnosis not present

## 2023-10-10 DIAGNOSIS — G4700 Insomnia, unspecified: Secondary | ICD-10-CM | POA: Diagnosis not present

## 2023-10-10 DIAGNOSIS — E876 Hypokalemia: Secondary | ICD-10-CM | POA: Diagnosis not present

## 2023-10-10 DIAGNOSIS — Z0000 Encounter for general adult medical examination without abnormal findings: Secondary | ICD-10-CM | POA: Diagnosis not present

## 2023-10-10 DIAGNOSIS — F324 Major depressive disorder, single episode, in partial remission: Secondary | ICD-10-CM | POA: Diagnosis not present

## 2023-10-10 DIAGNOSIS — F419 Anxiety disorder, unspecified: Secondary | ICD-10-CM | POA: Diagnosis not present

## 2024-01-26 ENCOUNTER — Encounter: Payer: Self-pay | Admitting: Advanced Practice Midwife

## 2024-04-08 DIAGNOSIS — K900 Celiac disease: Secondary | ICD-10-CM | POA: Diagnosis not present

## 2024-04-08 DIAGNOSIS — K5909 Other constipation: Secondary | ICD-10-CM | POA: Diagnosis not present

## 2024-04-15 DIAGNOSIS — Z79899 Other long term (current) drug therapy: Secondary | ICD-10-CM | POA: Diagnosis not present

## 2024-04-15 DIAGNOSIS — E7800 Pure hypercholesterolemia, unspecified: Secondary | ICD-10-CM | POA: Diagnosis not present

## 2024-04-15 DIAGNOSIS — E876 Hypokalemia: Secondary | ICD-10-CM | POA: Diagnosis not present

## 2024-07-25 ENCOUNTER — Telehealth: Payer: Self-pay | Admitting: *Deleted

## 2024-07-25 DIAGNOSIS — M8580 Other specified disorders of bone density and structure, unspecified site: Secondary | ICD-10-CM

## 2024-07-25 DIAGNOSIS — E2839 Other primary ovarian failure: Secondary | ICD-10-CM

## 2024-07-25 DIAGNOSIS — Z780 Asymptomatic menopausal state: Secondary | ICD-10-CM

## 2024-07-25 NOTE — Telephone Encounter (Signed)
 Patient left message on triage line requesting BMD.   Last AEX 05/31/23 -EB Patient request to schedule B&P exam, request another provider.  B&P scheduled with Dr. Nikki on 06/24/25 at 1430.   Last BMD: 11/18/19, osteopenia at [facility].  Patient request order to [facility], number provided for schedling [phone].   BMD order pended. Dx: postmenopausal, hypoestrogenism, osteopenia   Routing to Dr. Nikki to review.

## 2024-07-25 NOTE — Telephone Encounter (Signed)
 I agree with the plan and signed the order.

## 2024-08-13 NOTE — Progress Notes (Signed)
 Cynthia Alexander                                          MRN: [id number]   08/13/2024   The [facility] Quality Team Specialist reviewed this patient medical record for the purposes of chart review for care gap closure. The following were reviewed: chart review for care gap closure-controlling blood pressure.    [facility] Quality Team

## 2024-09-25 ENCOUNTER — Other Ambulatory Visit

## 2025-06-24 ENCOUNTER — Encounter: Admitting: Obstetrics and Gynecology
